# Patient Record
Sex: Male | Born: 1935 | Race: White | Hispanic: No | Marital: Married | Smoking: Former smoker
Health system: Southern US, Community
[De-identification: ages and names within clinical notes are randomized; demographics above are authoritative.]

## PROBLEM LIST (undated history)

## (undated) DIAGNOSIS — E7800 Pure hypercholesterolemia, unspecified: Secondary | ICD-10-CM

## (undated) DIAGNOSIS — F329 Major depressive disorder, single episode, unspecified: Secondary | ICD-10-CM

## (undated) DIAGNOSIS — G3184 Mild cognitive impairment, so stated: Secondary | ICD-10-CM

## (undated) DIAGNOSIS — I7090 Unspecified atherosclerosis: Secondary | ICD-10-CM

## (undated) DIAGNOSIS — I10 Essential (primary) hypertension: Secondary | ICD-10-CM

## (undated) DIAGNOSIS — I2510 Atherosclerotic heart disease of native coronary artery without angina pectoris: Secondary | ICD-10-CM

## (undated) DIAGNOSIS — E119 Type 2 diabetes mellitus without complications: Secondary | ICD-10-CM

## (undated) DIAGNOSIS — K529 Noninfective gastroenteritis and colitis, unspecified: Secondary | ICD-10-CM

## (undated) DIAGNOSIS — F32A Depression, unspecified: Secondary | ICD-10-CM

## (undated) DIAGNOSIS — G4733 Obstructive sleep apnea (adult) (pediatric): Secondary | ICD-10-CM

## (undated) DIAGNOSIS — H9190 Unspecified hearing loss, unspecified ear: Secondary | ICD-10-CM

## (undated) DIAGNOSIS — E039 Hypothyroidism, unspecified: Secondary | ICD-10-CM

## (undated) HISTORY — DX: Noninfective gastroenteritis and colitis, unspecified: K52.9

## (undated) HISTORY — DX: Type 2 diabetes mellitus without complications: E11.9

## (undated) HISTORY — DX: Mild cognitive impairment of uncertain or unknown etiology: G31.84

## (undated) HISTORY — DX: Unspecified hearing loss, unspecified ear: H91.90

## (undated) HISTORY — DX: Hypothyroidism, unspecified: E03.9

## (undated) HISTORY — DX: Unspecified atherosclerosis: I70.90

## (undated) HISTORY — PX: BLADDER SURGERY: SHX569

## (undated) HISTORY — DX: Atherosclerotic heart disease of native coronary artery without angina pectoris: I25.10

## (undated) HISTORY — PX: KNEE SURGERY: SHX244

## (undated) HISTORY — DX: Essential (primary) hypertension: I10

## (undated) HISTORY — DX: Pure hypercholesterolemia, unspecified: E78.00

## (undated) HISTORY — DX: Major depressive disorder, single episode, unspecified: F32.9

## (undated) HISTORY — DX: Obstructive sleep apnea (adult) (pediatric): G47.33

## (undated) HISTORY — PX: CATARACT EXTRACTION, BILATERAL: SHX1313

## (undated) HISTORY — PX: ELBOW FRACTURE SURGERY: SHX616

## (undated) HISTORY — DX: Depression, unspecified: F32.A

---

## 2018-11-04 ENCOUNTER — Ambulatory Visit: Payer: Medicare Other | Admitting: Neurology

## 2018-12-23 ENCOUNTER — Telehealth: Payer: Self-pay | Admitting: *Deleted

## 2018-12-23 ENCOUNTER — Encounter: Payer: Self-pay | Admitting: *Deleted

## 2018-12-23 NOTE — Telephone Encounter (Signed)
Spoke with Jory Sims, dgtr- in- law and advised her that due to current COVID 19 pandemic, our office is severely reducing in person visits in order to minimize the risk to our patients and healthcare providers. We recommend to convert your appointment to a video visit.  The patient is deaf and she stated she would rather have him come into office. We rescheduled his appt, and she verbalized understanding, appreciation.

## 2019-01-11 ENCOUNTER — Ambulatory Visit: Payer: Medicare Other | Admitting: Diagnostic Neuroimaging

## 2019-02-10 NOTE — Telephone Encounter (Signed)
LVM for Bobby Rojas- dgtr-in-law requesting a call back to discuss whether patient will come in office for visit next Tues afternoon, convert to video visit or reschedule. Left number for call back.

## 2019-02-10 NOTE — Telephone Encounter (Signed)
Noted. Appointment is flagged for sign language interpreter.

## 2019-02-10 NOTE — Telephone Encounter (Signed)
dght-in-law returned call and stated they would like to come in office for appt, advised to provide own mask, COVID questions asked, need of sign language interpreter

## 2019-02-14 ENCOUNTER — Encounter: Payer: Self-pay | Admitting: *Deleted

## 2019-02-15 ENCOUNTER — Ambulatory Visit: Payer: Medicare Other | Admitting: Diagnostic Neuroimaging

## 2019-02-15 ENCOUNTER — Other Ambulatory Visit: Payer: Self-pay

## 2019-02-15 ENCOUNTER — Encounter: Payer: Self-pay | Admitting: Diagnostic Neuroimaging

## 2019-02-15 VITALS — BP 147/78 | HR 68 | Temp 97.7°F | Ht 72.0 in | Wt 229.0 lb

## 2019-02-15 DIAGNOSIS — F0390 Unspecified dementia without behavioral disturbance: Secondary | ICD-10-CM

## 2019-02-15 DIAGNOSIS — G3183 Dementia with Lewy bodies: Secondary | ICD-10-CM | POA: Diagnosis not present

## 2019-02-15 DIAGNOSIS — F03B0 Unspecified dementia, moderate, without behavioral disturbance, psychotic disturbance, mood disturbance, and anxiety: Secondary | ICD-10-CM

## 2019-02-15 NOTE — Progress Notes (Signed)
[facility]  PATIENT: Bobby Rojas DOB: 1933/10/28  REFERRING CLINICIAN: Haimes, D HISTORY FROM: patient and daughter in law; with sign language interpreter REASON FOR VISIT: new consult    HISTORICAL  CHIEF COMPLAINT:  Chief Complaint  Patient presents with  . Memory Loss    rm 7, Mew pt, dgtr-in-law, Bobby Rojas, interpreter- Karen    HISTORY OF PRESENT ILLNESS:   85 year old male, hearing impaired, 6th grade education, here for evaluation of memory loss.  In July 2018 patient got lost driving, but eventually made it to his destination.  In January 2019 he went to get a haircut, ended up getting lost for more than 13 hours and was found by police sleeping in the back of his car.  He had evaluation was diagnosed with mild cognitive impairment.  He was tried on Aricept and Razadyne but these caused GI side effects.  Now on memantine and tolerating this better.  Patient lives at home with his wife.  He has problems with hygiene, remembering his medications, household chores.  He is no longer driving.  He is also developing short steps and balance difficulties.  He has had multiple falls.  No hallucinations.  He has had several bouts of confusion including thinking that his wife was deceased, when in fact she had just stepped out of the room for 5 minutes.  Patient's brother has been diagnosed with dementia with Lewy bodies.  Patient's wife is primary caregiver.  She is in limited health condition and therefore having difficulty taking care of patient.  They are looking at placement options.    REVIEW OF SYSTEMS: Full 14 system review of systems performed and negative with exception of: As per HPI.  ALLERGIES: No Known Allergies  HOME MEDICATIONS: Outpatient Medications Prior to Visit  Medication Sig Dispense Refill  . atorvastatin (LIPITOR) 40 MG tablet TAKE 1 TABLET BY MOUTH  DAILY    . Cholecalciferol (VITAMIN D3) 25 MCG (1000 UT) CAPS Take by mouth.    .  citalopram (CELEXA) 40 MG tablet TAKE 1 TABLET BY MOUTH  EVERY MORNING    . levothyroxine (SYNTHROID) 75 MCG tablet TAKE 1 TABLET BY MOUTH  DAILY AT 6AM    . lisinopril (ZESTRIL) 20 MG tablet TK 1 T PO QD    . memantine (NAMENDA) 5 MG tablet Take by mouth.    . metFORMIN (GLUCOPHAGE-XR) 500 MG 24 hr tablet Take 1,000 mg by mouth 2 (two) times daily.    . Multiple Vitamin (MULTIVITAMIN) tablet Take by mouth.    Marland Kitchen. rOPINIRole (REQUIP) 0.5 MG tablet     . donepezil (ARICEPT) 10 MG tablet     . famotidine (PEPCID) 20 MG tablet Take by mouth.    . galantamine (RAZADYNE ER) 8 MG 24 hr capsule      No facility-administered medications prior to visit.     PAST MEDICAL HISTORY: Past Medical History:  Diagnosis Date  . Atherosclerosis   . CAD (coronary artery disease)   . Deaf    bilateral  . Depression   . Diabetes mellitus without complication (HCC)   . Gastroenteritis   . Hypercholesterolemia   . Hypertension   . Hypothyroid   . MCI (mild cognitive impairment)   . OSA (obstructive sleep apnea)     PAST SURGICAL HISTORY: Past Surgical History:  Procedure Laterality Date  . BLADDER SURGERY    . CATARACT EXTRACTION, BILATERAL    . ELBOW FRACTURE SURGERY    . KNEE SURGERY Right  FAMILY HISTORY: No family history on file.  SOCIAL HISTORY: Social History   Socioeconomic History  . Marital status: Married    Spouse name: Not on file  . Number of children: 3  . Years of education: Not on file  . Highest education level: 6th grade  Occupational History  . Not on file  Social Needs  . Financial resource strain: Not on file  . Food insecurity    Worry: Not on file    Inability: Not on file  . Transportation needs    Medical: Not on file    Non-medical: Not on file  Tobacco Use  . Smoking status: Former Smoker    Types: Cigars  . Smokeless tobacco: Never Used  Substance and Sexual Activity  . Alcohol use: Not Currently  . Drug use: Never  . Sexual activity: Not on  file  Lifestyle  . Physical activity    Days per week: Not on file    Minutes per session: Not on file  . Stress: Not on file  Relationships  . Social Musicianconnections    Talks on phone: Not on file    Gets together: Not on file    Attends religious service: Not on file    Active member of club or organization: Not on file    Attends meetings of clubs or organizations: Not on file    Relationship status: Not on file  . Intimate partner violence    Fear of current or ex partner: Not on file    Emotionally abused: Not on file    Physically abused: Not on file    Forced sexual activity: Not on file  Other Topics Concern  . Not on file  Social History Narrative   Lives with wife at home     PHYSICAL EXAM  GENERAL EXAM/CONSTITUTIONAL: Vitals:  Vitals:   02/15/19 1504  BP: (!) 147/78  Pulse: 68  Temp: 97.7 F (36.5 C)  Weight: 229 lb (103.9 kg)  Height: 6' (1.829 m)     Body mass index is 31.06 kg/m. Wt Readings from Last 3 Encounters:  02/15/19 229 lb (103.9 kg)     Patient is in no distress; well developed, nourished and groomed; neck is supple  CARDIOVASCULAR:  Examination of carotid arteries is normal; no carotid bruits  Regular rate and rhythm, no murmurs  Examination of peripheral vascular system by observation and palpation is normal  EYES:  Ophthalmoscopic exam of optic discs and posterior segments is normal; no papilledema or hemorrhages  No exam data present  MUSCULOSKELETAL:  Gait, strength, tone, movements noted in Neurologic exam below  NEUROLOGIC: MENTAL STATUS:  MMSE - Mini Mental State Exam 02/15/2019  Orientation to time 0  Orientation to Place 1  Registration 0  Attention/ Calculation 0  Recall 0  Language- name 2 objects 2  Language- repeat 0  Language- follow 3 step command 0  Language- read & follow direction 1  Write a sentence 0  Copy design 0  Total score 4    awake, alert, oriented to person  [facility]   DECR  attention and concentration  language fluent, comprehension intact, naming intact; VIA SIGN LANGUAGE  fund of knowledge LIMITED  CRANIAL NERVE:   2nd - no papilledema on fundoscopic exam  2nd, 3rd, 4th, 6th - pupils equal and reactive to light, visual fields full to confrontation, extraocular muscles intact, no nystagmus  5th - facial sensation symmetric  7th - facial strength symmetric  8th -  hearing intact  9th - palate elevates symmetrically, uvula midline  11th - shoulder shrug symmetric  12th - tongue protrusion midline  MOTOR:   normal bulk  INCREASED COGWHEEL RIGIDITY IN BUE  full strength in the BUE, BLE  SENSORY:   normal and symmetric to light touch, temperature, vibration  COORDINATION:   finger-nose-finger, fine finger movements SLOW  REFLEXES:   deep tendon reflexes present and symmetric  GAIT/STATION:   SHORT STEPS; UNSTEADY     DIAGNOSTIC DATA (LABS, IMAGING, TESTING) - I reviewed patient records, labs, notes, testing and imaging myself where available.  No results found for: WBC, HGB, HCT, MCV, PLT No results found for: NA, K, CL, CO2, GLUCOSE, BUN, CREATININE, CALCIUM, PROT, ALBUMIN, AST, ALT, ALKPHOS, BILITOT, GFRNONAA, GFRAA No results found for: CHOL, HDL, LDLCALC, LDLDIRECT, TRIG, CHOLHDL No results found for: HGBA1C No results found for: VITAMINB12 No results found for: TSH   10/20/17 MRI brain 1. Moderate degree of chronic microvascular ischemic changes largely of the supratentorial and to a lesser extent infratentorial pathways. No signs of acute ischemia or hemorrhagic residua. Suggestion for iron deposition involving the lentiform nucleus which is of unclear clinical significance. 2. 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 No significant parenchymal changes of adjacent structures. 3. Mild diffuse cortical atrophy seen.    ASSESSMENT AND PLAN  85 y.o. year old male here with progressive short-term memory problems,  confusion, disorientation, navigation problems, decline in ADLs consistent with neurodegenerative dementia.  Dementia with Lewy bodies is a possibility.  Patient is in the moderate stage.  Dx:  1. Dementia with Lewy bodies (CODE) ([place])   2. Moderate dementia without behavioral disturbance (HCC)     PLAN:  - continue memantine; may increase up to 10mg  twice a day  - safety / supervision issues reviewed - caregiver resources provided - no driving  Return for return to PCP, pending if symptoms worsen or fail to improve.    Penni Bombard, MD 6/38/7564, 3:32 PM Certified in Neurology, Neurophysiology and Neuroimaging  [facility] [address] [place], [place] [address] [phone]

## 2021-02-12 ENCOUNTER — Emergency Department (HOSPITAL_BASED_OUTPATIENT_CLINIC_OR_DEPARTMENT_OTHER)
Admission: EM | Admit: 2021-02-12 | Discharge: 2021-02-12 | Disposition: A | Discharge status: Home / Self Care | Payer: Medicare Other | Attending: Emergency Medicine | Admitting: Emergency Medicine

## 2021-02-12 ENCOUNTER — Emergency Department (HOSPITAL_BASED_OUTPATIENT_CLINIC_OR_DEPARTMENT_OTHER): Payer: Medicare Other

## 2021-02-12 ENCOUNTER — Other Ambulatory Visit: Payer: Self-pay

## 2021-02-12 DIAGNOSIS — Z20822 Contact with and (suspected) exposure to covid-19: Secondary | ICD-10-CM | POA: Insufficient documentation

## 2021-02-12 DIAGNOSIS — R5383 Other fatigue: Secondary | ICD-10-CM | POA: Insufficient documentation

## 2021-02-12 DIAGNOSIS — I2510 Atherosclerotic heart disease of native coronary artery without angina pectoris: Secondary | ICD-10-CM | POA: Insufficient documentation

## 2021-02-12 DIAGNOSIS — R4182 Altered mental status, unspecified: Secondary | ICD-10-CM | POA: Insufficient documentation

## 2021-02-12 DIAGNOSIS — E039 Hypothyroidism, unspecified: Secondary | ICD-10-CM | POA: Diagnosis not present

## 2021-02-12 DIAGNOSIS — I10 Essential (primary) hypertension: Secondary | ICD-10-CM | POA: Diagnosis not present

## 2021-02-12 DIAGNOSIS — E860 Dehydration: Secondary | ICD-10-CM | POA: Insufficient documentation

## 2021-02-12 DIAGNOSIS — E119 Type 2 diabetes mellitus without complications: Secondary | ICD-10-CM | POA: Diagnosis not present

## 2021-02-12 DIAGNOSIS — Z79899 Other long term (current) drug therapy: Secondary | ICD-10-CM | POA: Diagnosis not present

## 2021-02-12 DIAGNOSIS — Z7984 Long term (current) use of oral hypoglycemic drugs: Secondary | ICD-10-CM | POA: Insufficient documentation

## 2021-02-12 DIAGNOSIS — Z87891 Personal history of nicotine dependence: Secondary | ICD-10-CM | POA: Diagnosis not present

## 2021-02-12 LAB — LIPASE, BLOOD: Lipase: 64 U/L — ABNORMAL HIGH (ref 11–51)

## 2021-02-12 LAB — URINALYSIS, ROUTINE W REFLEX MICROSCOPIC
Bilirubin Urine: NEGATIVE
Glucose, UA: NEGATIVE mg/dL
Hgb urine dipstick: NEGATIVE
Ketones, ur: NEGATIVE mg/dL
Leukocytes,Ua: NEGATIVE
Nitrite: NEGATIVE
Protein, ur: NEGATIVE mg/dL
Specific Gravity, Urine: 1.015 (ref 1.005–1.030)
pH: 7 (ref 5.0–8.0)

## 2021-02-12 LAB — CBC WITH DIFFERENTIAL/PLATELET
Abs Immature Granulocytes: 0.03 10*3/uL (ref 0.00–0.07)
Basophils Absolute: 0.1 10*3/uL (ref 0.0–0.1)
Basophils Relative: 1 %
Eosinophils Absolute: 0.2 10*3/uL (ref 0.0–0.5)
Eosinophils Relative: 3 %
HCT: 51.5 % (ref 39.0–52.0)
Hemoglobin: 17.2 g/dL — ABNORMAL HIGH (ref 13.0–17.0)
Immature Granulocytes: 0 %
Lymphocytes Relative: 17 %
Lymphs Abs: 1.2 10*3/uL (ref 0.7–4.0)
MCH: 31.2 pg (ref 26.0–34.0)
MCHC: 33.4 g/dL (ref 30.0–36.0)
MCV: 93.5 fL (ref 80.0–100.0)
Monocytes Absolute: 0.7 10*3/uL (ref 0.1–1.0)
Monocytes Relative: 9 %
Neutro Abs: 4.8 10*3/uL (ref 1.7–7.7)
Neutrophils Relative %: 70 %
Platelets: 342 10*3/uL (ref 150–400)
RBC: 5.51 MIL/uL (ref 4.22–5.81)
RDW: 13.2 % (ref 11.5–15.5)
WBC: 6.9 10*3/uL (ref 4.0–10.5)
nRBC: 0 % (ref 0.0–0.2)

## 2021-02-12 LAB — RESP PANEL BY RT-PCR (FLU A&B, COVID) ARPGX2
Influenza A by PCR: NEGATIVE
Influenza B by PCR: NEGATIVE
SARS Coronavirus 2 by RT PCR: NEGATIVE

## 2021-02-12 LAB — COMPREHENSIVE METABOLIC PANEL
ALT: 16 U/L (ref 0–44)
AST: 19 U/L (ref 15–41)
Albumin: 4.1 g/dL (ref 3.5–5.0)
Alkaline Phosphatase: 60 U/L (ref 38–126)
Anion gap: 7 (ref 5–15)
BUN: 18 mg/dL (ref 8–23)
CO2: 29 mmol/L (ref 22–32)
Calcium: 8.9 mg/dL (ref 8.9–10.3)
Chloride: 102 mmol/L (ref 98–111)
Creatinine, Ser: 1.31 mg/dL — ABNORMAL HIGH (ref 0.61–1.24)
GFR, Estimated: 53 mL/min — ABNORMAL LOW (ref >60)
Glucose, Bld: 131 mg/dL — ABNORMAL HIGH (ref 70–99)
Potassium: 4.4 mmol/L (ref 3.5–5.1)
Sodium: 138 mmol/L (ref 135–145)
Total Bilirubin: 1.1 mg/dL (ref 0.3–1.2)
Total Protein: 6.8 g/dL (ref 6.5–8.1)

## 2021-02-12 LAB — LACTIC ACID, PLASMA: Lactic Acid, Venous: 1.5 mmol/L (ref 0.5–1.9)

## 2021-02-12 LAB — TSH: TSH: 1.31 u[IU]/mL (ref 0.350–4.500)

## 2021-02-12 IMAGING — CT CT HEAD W/O CM
3 series · 14 of 47 positions shown, 16 images · non-contrast
Comparison: [DATE]

CLINICAL DATA: Altered mental status.

EXAM:
CT HEAD WITHOUT CONTRAST
TECHNIQUE: Contiguous axial images were obtained from the base of the skull
through the vertex without intravenous contrast.

[Series 2: head wo · axial · 0.47mm/px · z∈[-84,+56]mm · 8 of 34 slices shown, 10 images]
[im 3/34  brain]
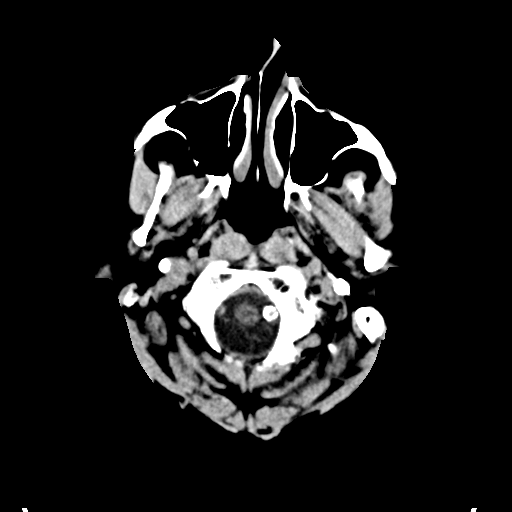
[im 3/34  bone]
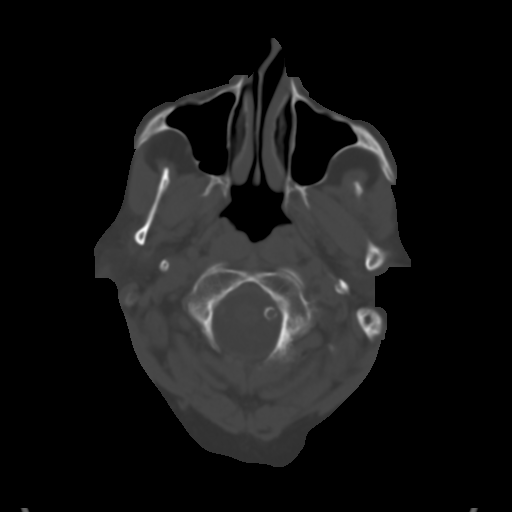
[im 7/34  brain]
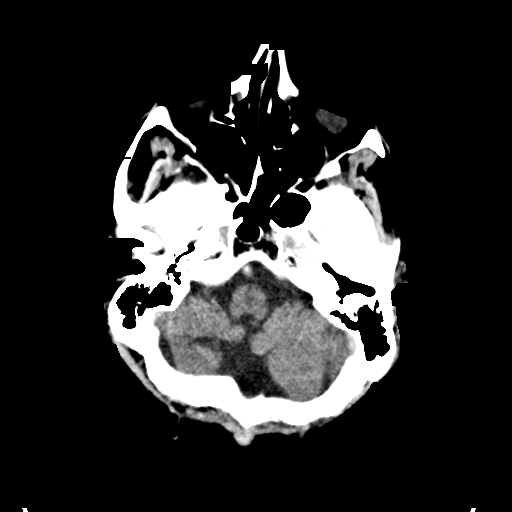
[im 11/34  brain]
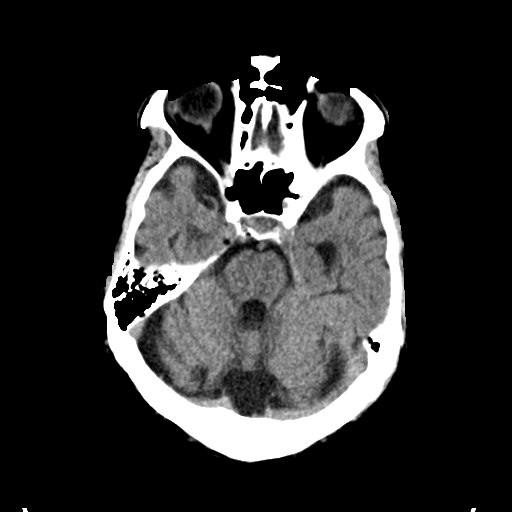
[im 15/34  brain]
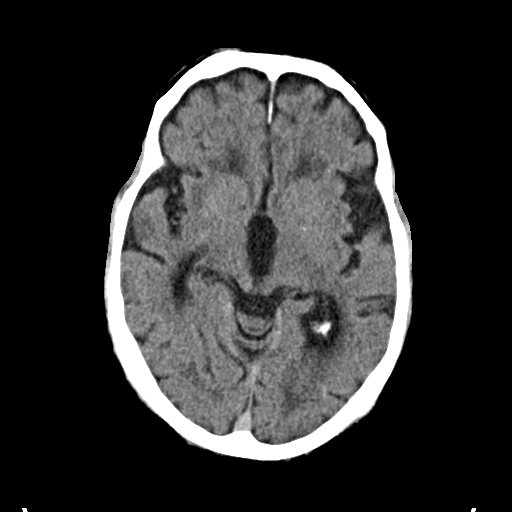
[im 19/34  brain]
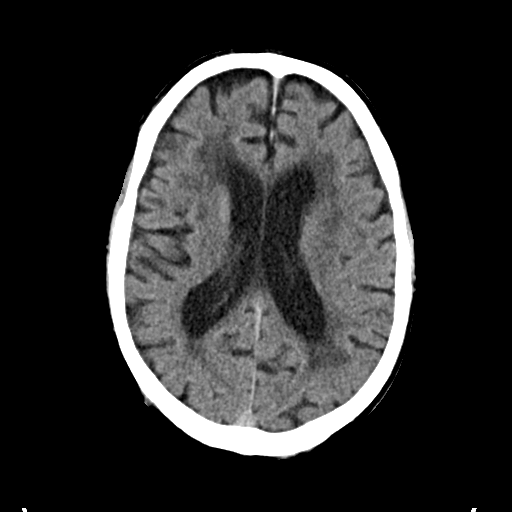
[im 19/34  bone]
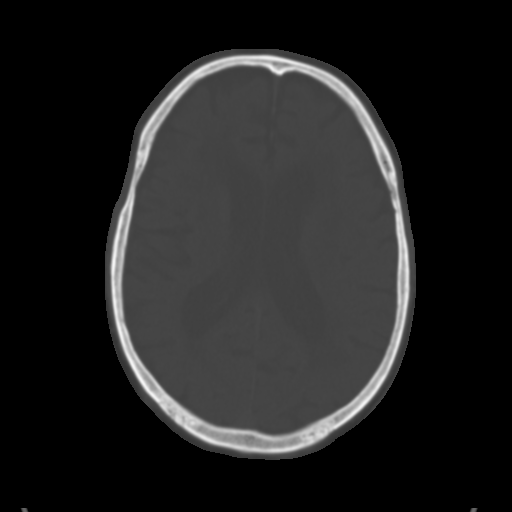
[im 23/34  brain]
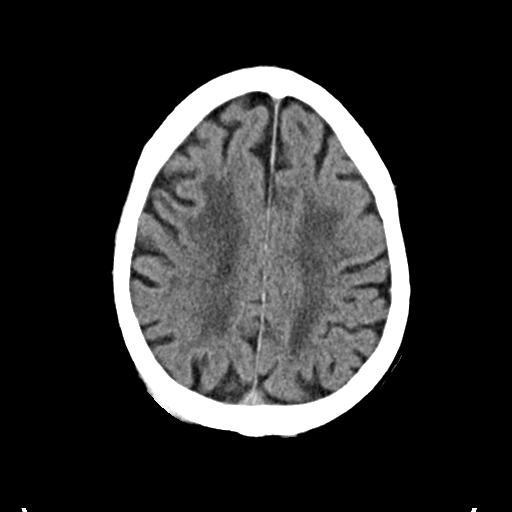
[im 27/34  brain]
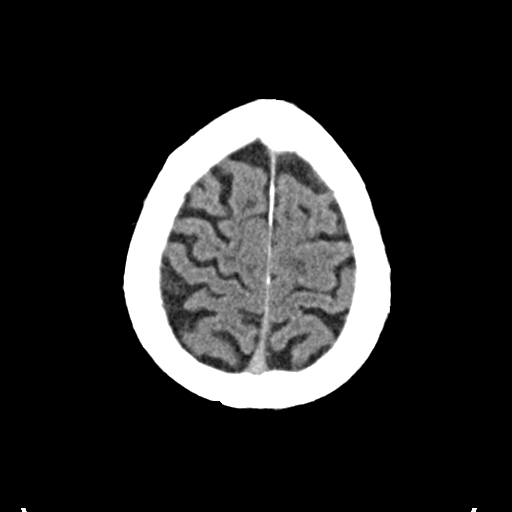
[im 31/34  brain]
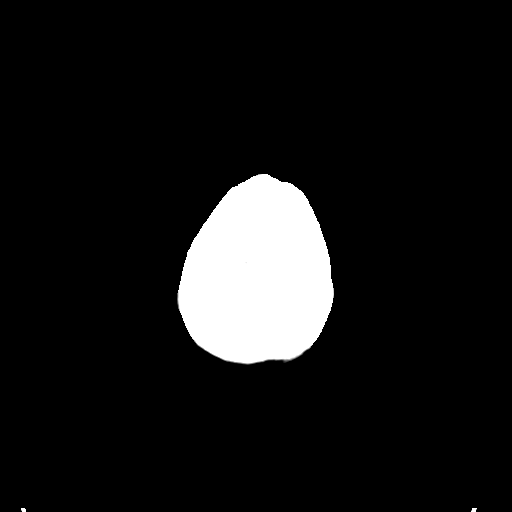

[Series 4: coronal soft · coronal · 0.32mm/px · 3 of 73 slices shown]
[im 25/73  brain]
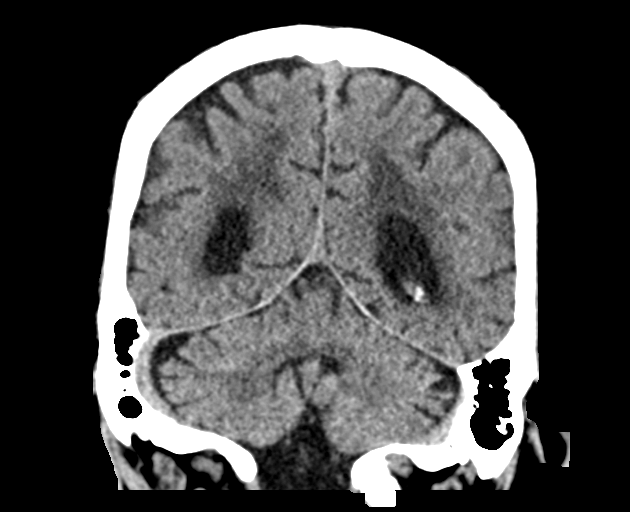
[im 33/73  brain]
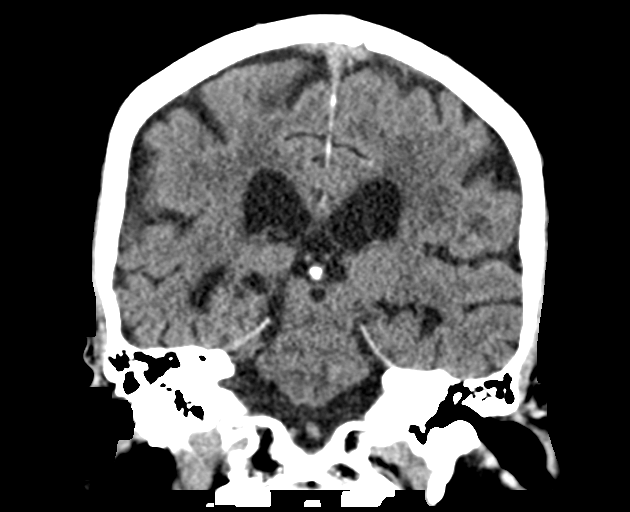
[im 41/73  brain]
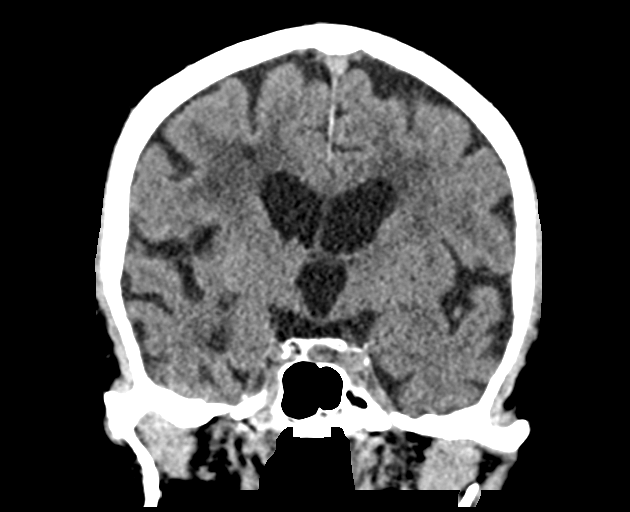

[Series 5: sag soft · sagittal · 0.32mm/px · 3 of 67 slices shown]
[im 23/67  brain]
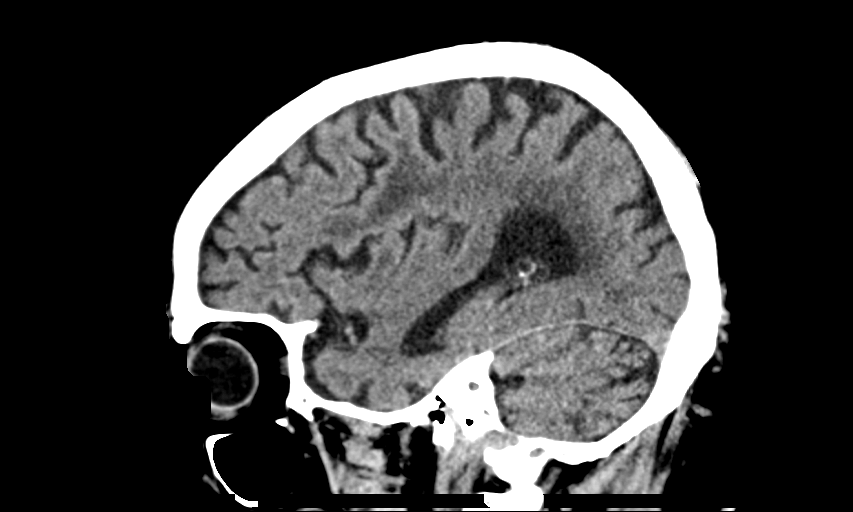
[im 34/67  brain]
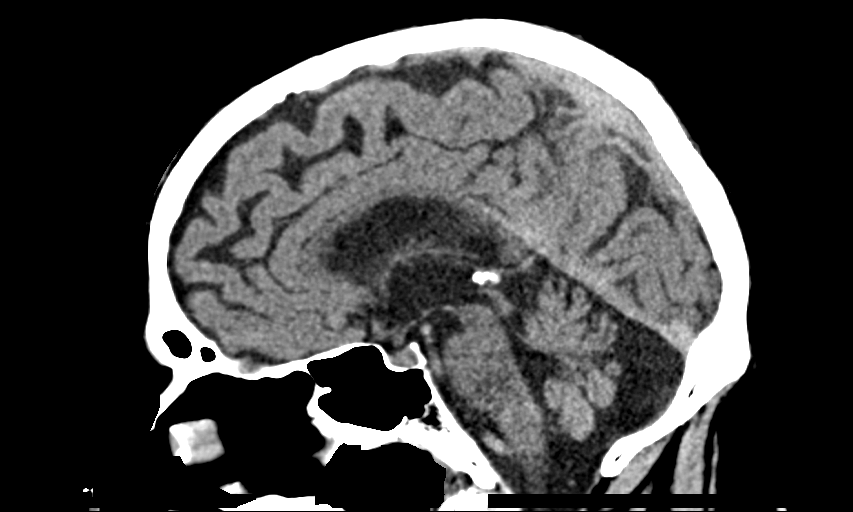
[im 45/67  brain]
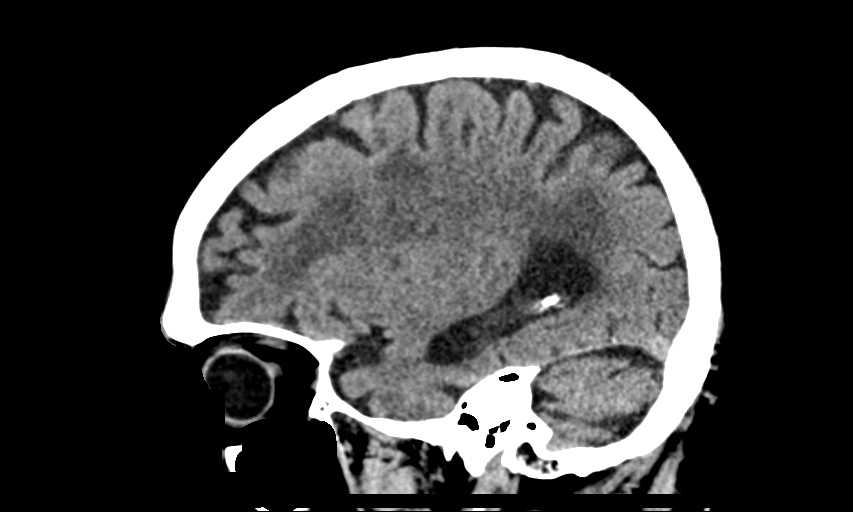

[14 of 47 positions shown; findings below may reference images not displayed]

FINDINGS: Brain: There is mild cerebral atrophy with widening of the
extra-axial spaces and ventricular dilatation.
There are areas of decreased attenuation within the white matter
tracts of the supratentorial brain, consistent with microvascular
disease changes.

Small, chronic bilateral basal ganglia lacunar infarcts are noted.

Chronic right cerebellar infarcts are also seen.

Vascular: No hyperdense vessel or unexpected calcification.

Skull: Normal. Negative for fracture or focal lesion.

Sinuses/Orbits: No acute finding.

Other: None.
IMPRESSION: 1. Generalized cerebral atrophy with chronic right cerebellar and
bilateral basal ganglia infarcts.
2. No acute intracranial abnormality.

## 2021-02-12 IMAGING — DX DG CHEST 2V
2 series · 2 of 2 positions shown · non-contrast
Comparison: [DATE]

CLINICAL DATA: Fatigue.

EXAM:
CHEST - 2 VIEW

[chest lat]
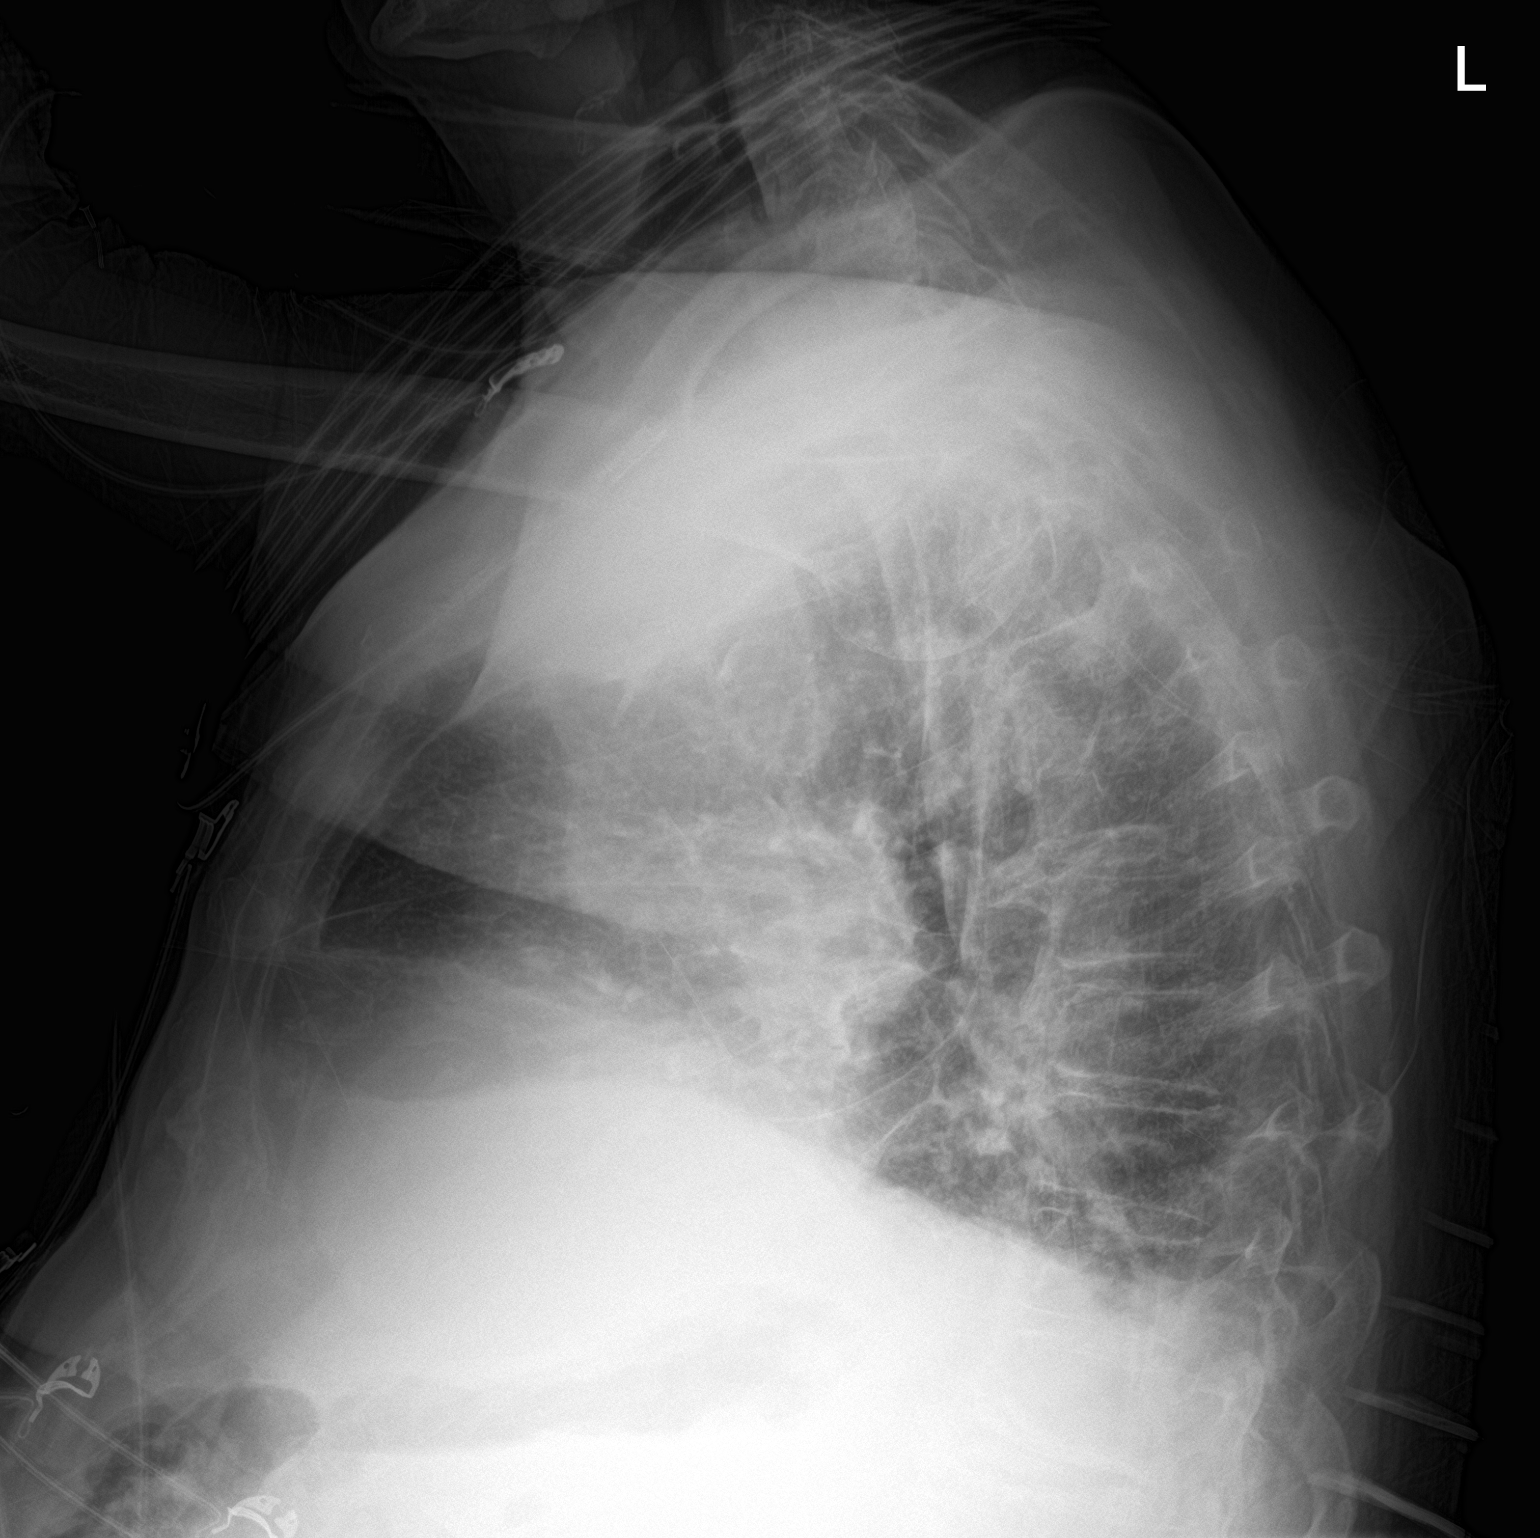

[chest ap strecther]
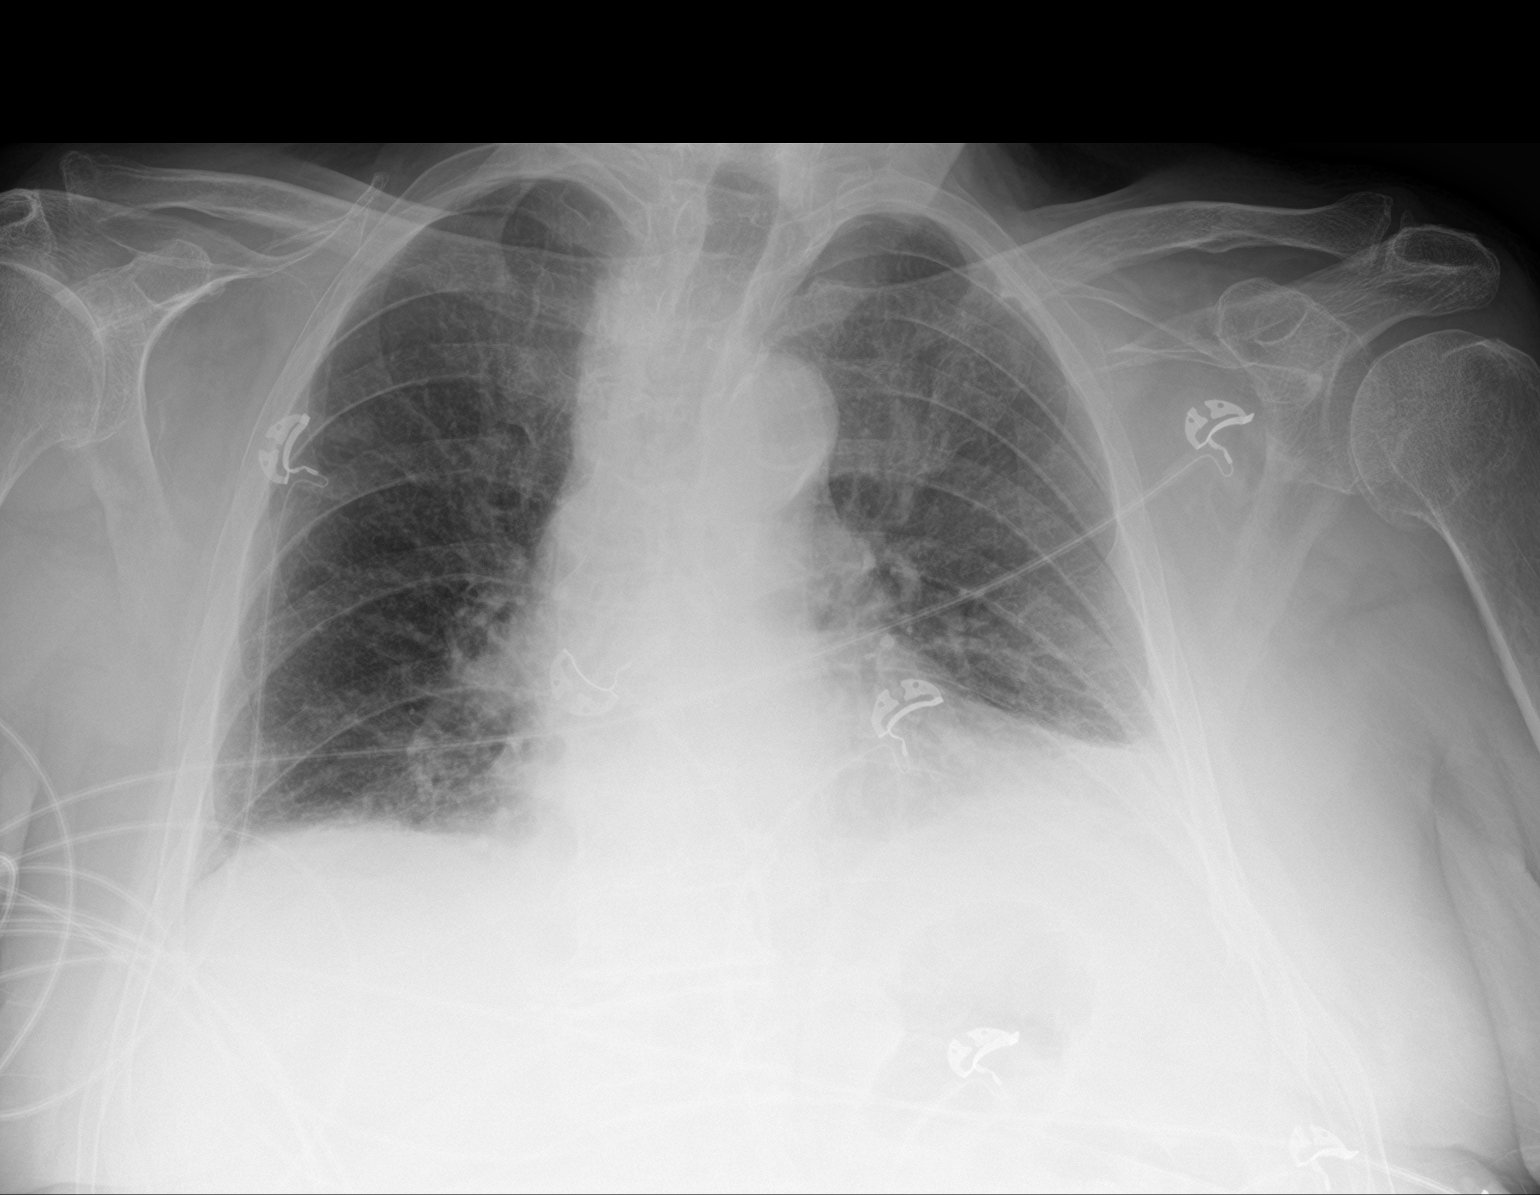

[2 of 2 positions shown; findings below may reference images not displayed]

FINDINGS: Low lung volumes are seen with mild, diffuse, chronic appearing
increased interstitial lung markings. Very mild atelectasis is seen
within the left lung base. There is no evidence of a pleural
effusion or pneumothorax. The cardiac silhouette is mildly enlarged.
There is moderate severity calcification of the aortic arch.
Multilevel degenerative changes seen throughout the thoracic spine.
IMPRESSION: Low lung volumes with very mild left basilar atelectasis.

## 2021-02-12 MED ORDER — SODIUM CHLORIDE 0.9 % IV BOLUS
500.0000 mL | Freq: Once | INTRAVENOUS | Status: AC
  Administered 2021-02-12: 500 mL via INTRAVENOUS

## 2021-02-12 MED ORDER — ATROPINE SULFATE 1 MG/ML IJ SOLN
INTRAMUSCULAR | Status: AC
  Filled 2021-02-12: qty 1

## 2021-02-12 NOTE — ED Triage Notes (Signed)
Arrived ems  staff stated that pt signed that he was in pain,  per ems pt could not tell them where he hurt,  at times he text to ems staff that he was in pain and other times stated no pain,  wife died about 1 month ago and depression meds were increased  pain is non verbal

## 2021-02-12 NOTE — ED Provider Notes (Signed)
[facility] EMERGENCY DEPARTMENT Provider Note   CSN: [id number] Arrival date & time: 02/12/21  1606     History No chief complaint on file.   Bobby PandaBuddy Rojas is a 87 y.o. male.  The history is provided by the patient and medical records. The history is limited by a language barrier. A language interpreter was used (ASL).  Illness Location:  Generalized fatigue Quality:  Moderate Severity:  Moderate Onset quality:  Gradual Duration:  1 day Timing:  Constant Progression:  Unchanged Chronicity:  New Associated symptoms: fatigue   Associated symptoms: no abdominal pain, no chest pain, no congestion, no cough, no diarrhea, no fever, no headaches, no loss of consciousness, no nausea, no rash, no rhinorrhea, no shortness of breath, no vomiting and no wheezing       Past Medical History:  Diagnosis Date   Atherosclerosis    CAD (coronary artery disease)    Deaf    bilateral   Depression    Diabetes mellitus without complication (HCC)    Gastroenteritis    Hypercholesterolemia    Hypertension    Hypothyroid    MCI (mild cognitive impairment)    OSA (obstructive sleep apnea)     There are no problems to display for this patient.   Past Surgical History:  Procedure Laterality Date   BLADDER SURGERY     CATARACT EXTRACTION, BILATERAL     ELBOW FRACTURE SURGERY     KNEE SURGERY Right        No family history on file.  Social History   Tobacco Use   Smoking status: Former    Pack years: 0.00    Types: Cigars   Smokeless tobacco: Never  Substance Use Topics   Alcohol use: Not Currently   Drug use: Never    Home Medications Prior to Admission medications   Medication Sig Start Date End Date Taking? Authorizing Provider  atorvastatin (LIPITOR) 40 MG tablet TAKE 1 TABLET BY MOUTH  DAILY 08/23/18   [provider]  Cholecalciferol (VITAMIN D3) 25 MCG (1000 UT) CAPS Take by mouth.    [provider]  citalopram (CELEXA) 40 MG tablet TAKE  1 TABLET BY MOUTH  EVERY MORNING 11/12/18   [provider]  famotidine (PEPCID) 20 MG tablet Take by mouth. 10/19/18   [provider]  levothyroxine (SYNTHROID) 75 MCG tablet TAKE 1 TABLET BY MOUTH  DAILY AT 6AM 08/01/15   [provider]  lisinopril (ZESTRIL) 20 MG tablet TK 1 T PO QD 10/14/18   [provider]  memantine (NAMENDA) 5 MG tablet Take by mouth. 11/12/18   [provider]  metFORMIN (GLUCOPHAGE-XR) 500 MG 24 hr tablet Take 1,000 mg by mouth 2 (two) times daily. 10/14/18   [provider]  Multiple Vitamin (MULTIVITAMIN) tablet Take by mouth.    [provider]  rOPINIRole (REQUIP) 0.5 MG tablet  10/29/18   [provider]    Allergies    Patient has no known allergies.  Review of Systems   Review of Systems  Constitutional:  Positive for appetite change and fatigue. Negative for chills, diaphoresis and fever.  HENT:  Negative for congestion and rhinorrhea.   Eyes:  Negative for visual disturbance.  Respiratory:  Negative for cough, chest tightness, shortness of breath and wheezing.   Cardiovascular:  Negative for chest pain and palpitations.  Gastrointestinal:  Negative for abdominal pain, constipation, diarrhea, nausea and vomiting.  Genitourinary:  Negative for dysuria, flank pain and frequency.  Musculoskeletal:  Negative for back pain and neck pain.  Skin:  Negative for rash and wound.  Neurological:  Negative for loss of consciousness, weakness, light-headedness, numbness and headaches.  Psychiatric/Behavioral:  Negative for agitation and confusion.   All other systems reviewed and are negative.  Physical Exam Updated Vital Signs BP (!) 142/70 (BP Location: Right Arm)   Pulse (!) 57   Temp 97.7 F (36.5 C) (Oral)   Resp 14   SpO2 98%   Physical Exam Vitals and nursing note reviewed.  Constitutional:      General: He is not in acute distress.    Appearance: He is well-developed. He is not  ill-appearing, toxic-appearing or diaphoretic.  HENT:     Head: Normocephalic and atraumatic.     Nose: No congestion or rhinorrhea.     Mouth/Throat:     Mouth: Mucous membranes are dry.     Pharynx: No oropharyngeal exudate or posterior oropharyngeal erythema.  Eyes:     Conjunctiva/sclera: Conjunctivae normal.     Pupils: Pupils are equal, round, and reactive to light.  Cardiovascular:     Rate and Rhythm: Normal rate and regular rhythm.     Heart sounds: No murmur heard. Pulmonary:     Effort: Pulmonary effort is normal. No respiratory distress.     Breath sounds: Normal breath sounds. No wheezing, rhonchi or rales.  Chest:     Chest wall: No tenderness.  Abdominal:     Palpations: Abdomen is soft.     Tenderness: There is no abdominal tenderness. There is no right CVA tenderness, left CVA tenderness, guarding or rebound.  Musculoskeletal:        General: No tenderness.     Cervical back: Neck supple. No tenderness.     Right lower leg: No edema.     Left lower leg: No edema.  Skin:    General: Skin is warm and dry.     Capillary Refill: Capillary refill takes less than 2 seconds.     Findings: No erythema or rash.  Neurological:     General: No focal deficit present.     Mental Status: He is alert.     Sensory: No sensory deficit.     Motor: No weakness.  Psychiatric:        Mood and Affect: Mood normal.    ED Results / Procedures / Treatments   Labs (all labs ordered are listed, but only abnormal results are displayed) Labs Reviewed  CBC WITH DIFFERENTIAL/PLATELET - Abnormal; Notable for the following components:      Result Value   Hemoglobin 17.2 (*)    All other components within normal limits  COMPREHENSIVE METABOLIC PANEL - Abnormal; Notable for the following components:   Glucose, Bld 131 (*)    Creatinine, Ser 1.31 (*)    GFR, Estimated 53 (*)    All other components within normal limits  LIPASE, BLOOD - Abnormal; Notable for the following components:    Lipase 64 (*)    All other components within normal limits  RESP PANEL BY RT-PCR (FLU A&B, COVID) [id number]  URINE CULTURE  LACTIC ACID, PLASMA  URINALYSIS, ROUTINE W REFLEX MICROSCOPIC  LACTIC ACID, PLASMA  TSH    EKG EKG Interpretation  Date/Time:  Tuesday February 12 2021 16:45:56 EDT Ventricular Rate:  55 PR Interval:  173 QRS Duration: 88 QT Interval:  459 QTC Calculation: 439 R Axis:   20 Text Interpretation: Sinus rhythm Low voltage, precordial leads NO prior ECG for  comparison No STEMI Confirmed by Theda Belfast ([id number]) on 02/12/2021 4:55:25 PM  Radiology DG Chest 2 View  Result Date: 02/12/2021 CLINICAL DATA:  Fatigue. EXAM: CHEST - 2 VIEW COMPARISON:  September 21, 2018 FINDINGS: Low lung volumes are seen with mild, diffuse, chronic appearing increased interstitial lung markings. Very mild atelectasis is seen within the left lung base. There is no evidence of a pleural effusion or pneumothorax. The cardiac silhouette is mildly enlarged. There is moderate severity calcification of the aortic arch. Multilevel degenerative changes seen throughout the thoracic spine. IMPRESSION: Low lung volumes with very mild left basilar atelectasis. Electronically Signed   By: Aram Candela M.D.   On: 02/12/2021 17:43   CT Head Wo Contrast  Result Date: 02/12/2021 CLINICAL DATA:  Altered mental status. EXAM: CT HEAD WITHOUT CONTRAST TECHNIQUE: Contiguous axial images were obtained from the base of the skull through the vertex without intravenous contrast. COMPARISON:  July 10, 2020 FINDINGS: Brain: There is mild cerebral atrophy with widening of the extra-axial spaces and ventricular dilatation. There are areas of decreased attenuation within the white matter tracts of the supratentorial brain, consistent with microvascular disease changes. Small, chronic bilateral basal ganglia lacunar infarcts are noted. Chronic right cerebellar infarcts are also seen. Vascular: No hyperdense vessel or  unexpected calcification. Skull: Normal. Negative for fracture or focal lesion. Sinuses/Orbits: No acute finding. Other: None. IMPRESSION: 1. Generalized cerebral atrophy with chronic right cerebellar and bilateral basal ganglia infarcts. 2. No acute intracranial abnormality. Electronically Signed   By: Aram Candela M.D.   On: 02/12/2021 17:45    Procedures Procedures   Medications Ordered in ED Medications  sodium chloride 0.9 % bolus 500 mL (0 mLs Intravenous Stopped 02/12/21 2007)    ED Course  I have reviewed the triage vital signs and the nursing notes.  Pertinent labs & imaging results that were available during my care of the patient were reviewed by me and considered in my medical decision making (see chart for details).    MDM Rules/Calculators/A&P                          Domingos Mejorado is a 87 y.o. male with a past medical history significant for complete deafness with sign language dependence, hypothyroidism, hypertension, hypercholesterolemia, GERD, diabetes, CAD, mild cog impairment, and depression who presents from a nursing facility for report of fatigue, not eating food, not taking medicine, and complaining of pain.  According to EMS, patient arrives from facility where for the first time today, he had a change in attitude where he was not eating, not drinking, and was signing to them that he was having some pain.  He did not elaborate EMS where he was hurting at 1 point, did tell EMS that it was some emotional type pain.  Per EMS, patient recently lost his wife about a month ago.  According to EMS and nursing team on arrival, patient was covered in urine and was acting very fatigued and sleepy.  A sign language interpreter was used for all conversation with the patient.  Patient is denying any fevers, chills, chest pain, shortness of breath, palpitations, cough, nausea, vomiting, constipation, diarrhea, or urinary changes.  He is denying to me any pain in locations including  no back pain, extremity pains, headache, or neck pain.  He is reporting feeling very tired and fatigued and drained and does not know why he has no appetite and is not taking medications today.  He  did not report any depression to me but just shook his head that he did not know.  On exam, lungs are clear and chest is nontender.  Abdomen is nontender.  Extremities nontender.  Good pulses in extremities.  Moving all extremities.  Symmetric smile.  Patient did not speak but did move his hands with sign language.  Mucous membranes are slightly dry.  No rashes seen.  Patient was cleaned up by emergency team prior to my evaluation.  Will get screening work-up including x-ray, CT head, urine, and labs to look for etiology of the patient's severe fatigue and mental status change with no appetite and not taking medications.  If work-up is reassuring, anticipate reassessment to determine disposition.   Patient's work-up was overall reassuring.  Patient was back to his mental status baseline after the fluids per the son.  Patient was much more interactive and able to sign and conversation.  Patient's urine does not show infection.  CT head did not show acute stroke or bleed.  No pneumonia or other significant electrolyte abnormality seen.  Suspect some hemoconcentration with his dehydration and now he is back to baseline, we feel he is safe for discharge home.  Son agrees and will get him discharged to follow-up with PCP and have him maintain hydration at his facility.   Final Clinical Impression(s) / ED Diagnoses Final diagnoses:  Fatigue, unspecified type  Dehydration    Rx / DC Orders ED Discharge Orders     None       Clinical Impression: 1. Fatigue, unspecified type   2. Dehydration     Disposition: Discharge  Condition: Good  I have discussed the results, Dx and Tx plan with the pt(& family if present). He/she/they expressed understanding and agree(s) with the plan. Discharge  instructions discussed at great length. Strict return precautions discussed and pt &/or family have verbalized understanding of the instructions. No further questions at time of discharge.    Discharge Medication List as of 02/12/2021  9:57 PM      Follow Up: Jolene Provost, MD [address] [name] [address] [phone]     [facility]Y DEPARTMENT [address] [id number] UV [id number] [place] [address] [phone]       Javeion Cannedy, Canary Brim, MD 02/12/21 2231

## 2021-02-12 NOTE — ED Triage Notes (Signed)
Triage via sign language interpreter by Dr Julieanne Manson. Pt. Denies pain, fever, chills.. C/o Fatigue NOS.

## 2021-02-12 NOTE — ED Notes (Addendum)
Upon moving over to stretcher, bed noted to be saturated in urine. Pants covered in urine, as well as full depends. Pt cleaned with soap and water, as well as barrier cream applied. New depends placed and changed into a gown. Urine smelled malodorous.

## 2021-02-12 NOTE — Discharge Instructions (Addendum)
Your work-up today was overall reassuring but we did see some signs indicative of some dehydration.  After the fluids, you had improvement in her mental status.  We did not find convincing evidence of significant abnormalities or infection.  As you are feeling better, we feel you are safe for discharge back to your facility.  Please encourage hydration for the next few days and please rest.  If any symptoms change or worsen acutely, please return to the nearest emergency department.

## 2021-02-12 NOTE — ED Notes (Signed)
Patient transported to CT 

## 2021-02-14 LAB — URINE CULTURE

## 2021-04-23 ENCOUNTER — Other Ambulatory Visit: Payer: Self-pay

## 2021-04-23 ENCOUNTER — Inpatient Hospital Stay (HOSPITAL_COMMUNITY)
Admission: EM | Admit: 2021-04-23 | Discharge: 2021-04-28 | DRG: 069 | Disposition: A | Discharge status: Skilled Nursing Facility | Payer: Medicare Other | Source: Skilled Nursing Facility | Attending: Internal Medicine | Admitting: Internal Medicine

## 2021-04-23 ENCOUNTER — Emergency Department (HOSPITAL_COMMUNITY): Payer: Medicare Other

## 2021-04-23 ENCOUNTER — Observation Stay (HOSPITAL_COMMUNITY): Payer: Medicare Other

## 2021-04-23 DIAGNOSIS — I10 Essential (primary) hypertension: Secondary | ICD-10-CM | POA: Diagnosis present

## 2021-04-23 DIAGNOSIS — Z7984 Long term (current) use of oral hypoglycemic drugs: Secondary | ICD-10-CM

## 2021-04-23 DIAGNOSIS — G9341 Metabolic encephalopathy: Secondary | ICD-10-CM | POA: Diagnosis present

## 2021-04-23 DIAGNOSIS — F32A Depression, unspecified: Secondary | ICD-10-CM | POA: Diagnosis present

## 2021-04-23 DIAGNOSIS — J90 Pleural effusion, not elsewhere classified: Secondary | ICD-10-CM | POA: Diagnosis present

## 2021-04-23 DIAGNOSIS — E1159 Type 2 diabetes mellitus with other circulatory complications: Secondary | ICD-10-CM

## 2021-04-23 DIAGNOSIS — Z79899 Other long term (current) drug therapy: Secondary | ICD-10-CM

## 2021-04-23 DIAGNOSIS — R29704 NIHSS score 4: Secondary | ICD-10-CM | POA: Diagnosis present

## 2021-04-23 DIAGNOSIS — E785 Hyperlipidemia, unspecified: Secondary | ICD-10-CM | POA: Diagnosis present

## 2021-04-23 DIAGNOSIS — E119 Type 2 diabetes mellitus without complications: Secondary | ICD-10-CM | POA: Diagnosis present

## 2021-04-23 DIAGNOSIS — I639 Cerebral infarction, unspecified: Secondary | ICD-10-CM | POA: Diagnosis not present

## 2021-04-23 DIAGNOSIS — I2510 Atherosclerotic heart disease of native coronary artery without angina pectoris: Secondary | ICD-10-CM | POA: Diagnosis present

## 2021-04-23 DIAGNOSIS — Z79890 Hormone replacement therapy (postmenopausal): Secondary | ICD-10-CM

## 2021-04-23 DIAGNOSIS — G459 Transient cerebral ischemic attack, unspecified: Secondary | ICD-10-CM

## 2021-04-23 DIAGNOSIS — J9601 Acute respiratory failure with hypoxia: Secondary | ICD-10-CM | POA: Diagnosis present

## 2021-04-23 DIAGNOSIS — E7800 Pure hypercholesterolemia, unspecified: Secondary | ICD-10-CM | POA: Diagnosis present

## 2021-04-23 DIAGNOSIS — H913 Deaf nonspeaking, not elsewhere classified: Secondary | ICD-10-CM | POA: Diagnosis present

## 2021-04-23 DIAGNOSIS — G4733 Obstructive sleep apnea (adult) (pediatric): Secondary | ICD-10-CM | POA: Diagnosis present

## 2021-04-23 DIAGNOSIS — R29810 Facial weakness: Secondary | ICD-10-CM | POA: Diagnosis present

## 2021-04-23 DIAGNOSIS — U071 COVID-19: Secondary | ICD-10-CM | POA: Diagnosis not present

## 2021-04-23 DIAGNOSIS — Z66 Do not resuscitate: Secondary | ICD-10-CM | POA: Diagnosis present

## 2021-04-23 DIAGNOSIS — G8194 Hemiplegia, unspecified affecting left nondominant side: Secondary | ICD-10-CM | POA: Diagnosis present

## 2021-04-23 DIAGNOSIS — E039 Hypothyroidism, unspecified: Secondary | ICD-10-CM | POA: Diagnosis present

## 2021-04-23 DIAGNOSIS — J1282 Pneumonia due to coronavirus disease 2019: Secondary | ICD-10-CM | POA: Diagnosis present

## 2021-04-23 DIAGNOSIS — F0390 Unspecified dementia without behavioral disturbance: Secondary | ICD-10-CM | POA: Diagnosis present

## 2021-04-23 DIAGNOSIS — R531 Weakness: Secondary | ICD-10-CM

## 2021-04-23 DIAGNOSIS — Z87891 Personal history of nicotine dependence: Secondary | ICD-10-CM

## 2021-04-23 DIAGNOSIS — R0600 Dyspnea, unspecified: Secondary | ICD-10-CM

## 2021-04-23 LAB — CBG MONITORING, ED
Glucose-Capillary: 131 mg/dL — ABNORMAL HIGH (ref 70–99)
Glucose-Capillary: 115 mg/dL — ABNORMAL HIGH (ref 70–99)

## 2021-04-23 LAB — COMPREHENSIVE METABOLIC PANEL
ALT: 18 U/L (ref 0–44)
AST: 34 U/L (ref 15–41)
Albumin: 3.6 g/dL (ref 3.5–5.0)
Alkaline Phosphatase: 51 U/L (ref 38–126)
Anion gap: 7 (ref 5–15)
BUN: 22 mg/dL (ref 8–23)
CO2: 26 mmol/L (ref 22–32)
Calcium: 8.8 mg/dL — ABNORMAL LOW (ref 8.9–10.3)
Chloride: 105 mmol/L (ref 98–111)
Creatinine, Ser: 1.32 mg/dL — ABNORMAL HIGH (ref 0.61–1.24)
GFR, Estimated: 53 mL/min — ABNORMAL LOW (ref >60)
Glucose, Bld: 132 mg/dL — ABNORMAL HIGH (ref 70–99)
Potassium: 4.7 mmol/L (ref 3.5–5.1)
Sodium: 138 mmol/L (ref 135–145)
Total Bilirubin: 1.3 mg/dL — ABNORMAL HIGH (ref 0.3–1.2)
Total Protein: 6.1 g/dL — ABNORMAL LOW (ref 6.5–8.1)

## 2021-04-23 LAB — I-STAT CHEM 8, ED
BUN: 26 mg/dL — ABNORMAL HIGH (ref 8–23)
Calcium, Ion: 1.06 mmol/L — ABNORMAL LOW (ref 1.15–1.40)
Chloride: 104 mmol/L (ref 98–111)
Creatinine, Ser: 1.3 mg/dL — ABNORMAL HIGH (ref 0.61–1.24)
Glucose, Bld: 128 mg/dL — ABNORMAL HIGH (ref 70–99)
HCT: 48 % (ref 39.0–52.0)
Hemoglobin: 16.3 g/dL (ref 13.0–17.0)
Potassium: 4.3 mmol/L (ref 3.5–5.1)
Sodium: 140 mmol/L (ref 135–145)
TCO2: 27 mmol/L (ref 22–32)

## 2021-04-23 LAB — DIFFERENTIAL
Abs Immature Granulocytes: 0.07 10*3/uL (ref 0.00–0.07)
Basophils Absolute: 0 10*3/uL (ref 0.0–0.1)
Basophils Relative: 1 %
Eosinophils Absolute: 0 10*3/uL (ref 0.0–0.5)
Eosinophils Relative: 0 %
Immature Granulocytes: 1 %
Lymphocytes Relative: 10 %
Lymphs Abs: 0.8 10*3/uL (ref 0.7–4.0)
Monocytes Absolute: 1.1 10*3/uL — ABNORMAL HIGH (ref 0.1–1.0)
Monocytes Relative: 14 %
Neutro Abs: 6.1 10*3/uL (ref 1.7–7.7)
Neutrophils Relative %: 74 %

## 2021-04-23 LAB — CBC
HCT: 49.8 % (ref 39.0–52.0)
Hemoglobin: 16.3 g/dL (ref 13.0–17.0)
MCH: 30.9 pg (ref 26.0–34.0)
MCHC: 32.7 g/dL (ref 30.0–36.0)
MCV: 94.5 fL (ref 80.0–100.0)
Platelets: 309 10*3/uL (ref 150–400)
RBC: 5.27 MIL/uL (ref 4.22–5.81)
RDW: 13.5 % (ref 11.5–15.5)
WBC: 8.1 10*3/uL (ref 4.0–10.5)
nRBC: 0 % (ref 0.0–0.2)

## 2021-04-23 LAB — RESP PANEL BY RT-PCR (FLU A&B, COVID) ARPGX2
Influenza A by PCR: NEGATIVE
Influenza B by PCR: NEGATIVE
SARS Coronavirus 2 by RT PCR: POSITIVE — AB

## 2021-04-23 LAB — PROTIME-INR
INR: 1.2 (ref 0.8–1.2)
Prothrombin Time: 14.9 seconds (ref 11.4–15.2)

## 2021-04-23 LAB — APTT: aPTT: 29 seconds (ref 24–36)

## 2021-04-23 IMAGING — CT CT HEAD CODE STROKE
3 of 4 series · 13 of 47 positions shown, 15 images · non-contrast
Comparison: [DATE]

CLINICAL DATA: Code stroke. Neuro deficit, acute, stroke suspected.
Left facial droop and left arm tingling.

EXAM:
CT HEAD WITHOUT CONTRAST
TECHNIQUE: Contiguous axial images were obtained from the base of the skull
through the vertex without intravenous contrast.

[Series 3: head wo · axial · 0.36mm/px · z∈[+1319,+1447]mm · 7 of 36 slices shown, 9 images]
[im 5/36  brain]
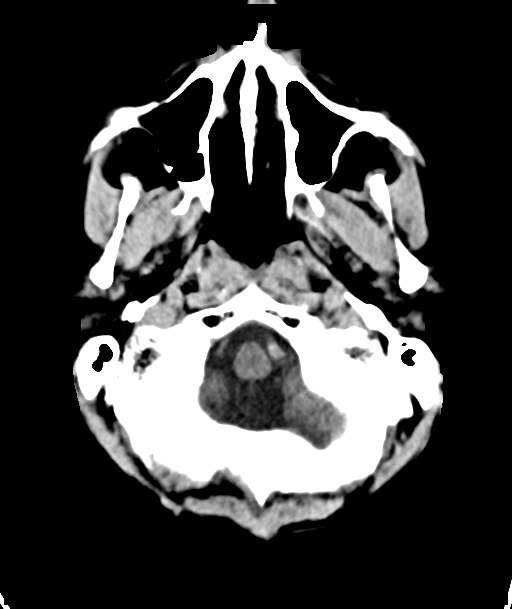
[im 5/36  bone]
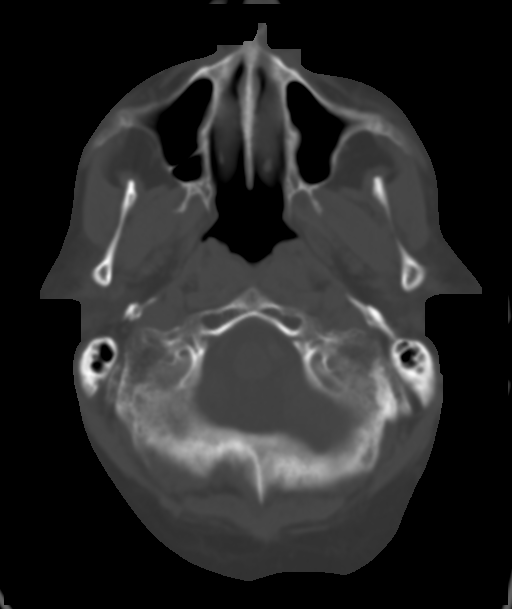
[im 9/36  brain]
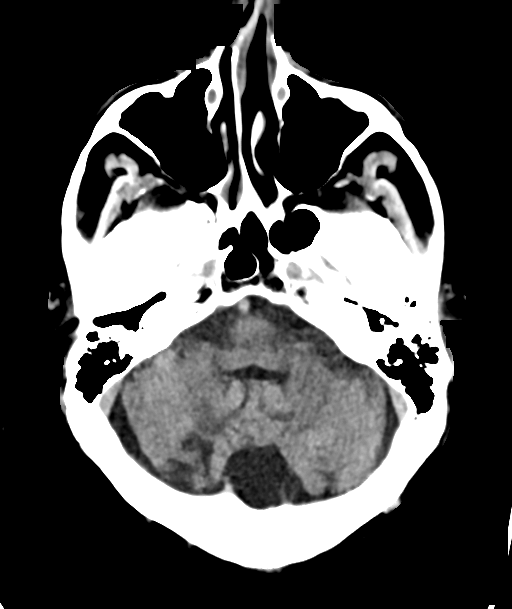
[im 14/36  brain]
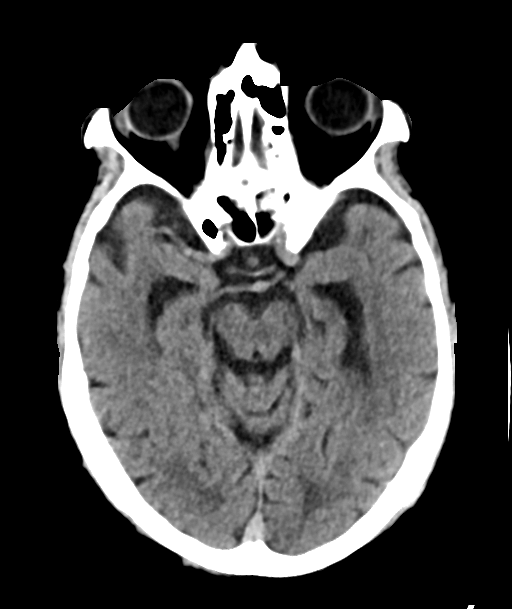
[im 18/36  brain]
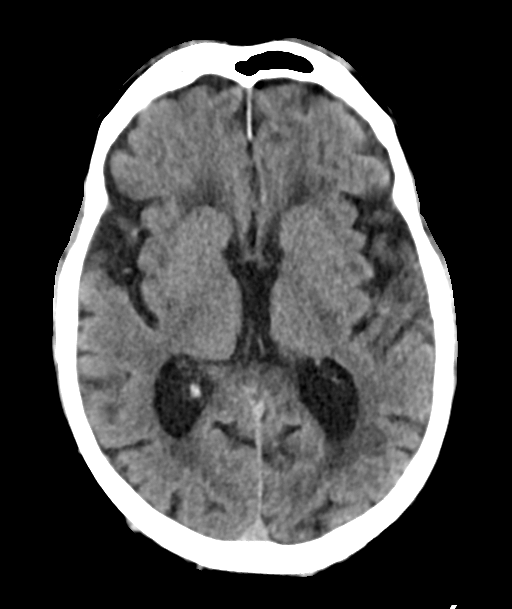
[im 22/36  brain]
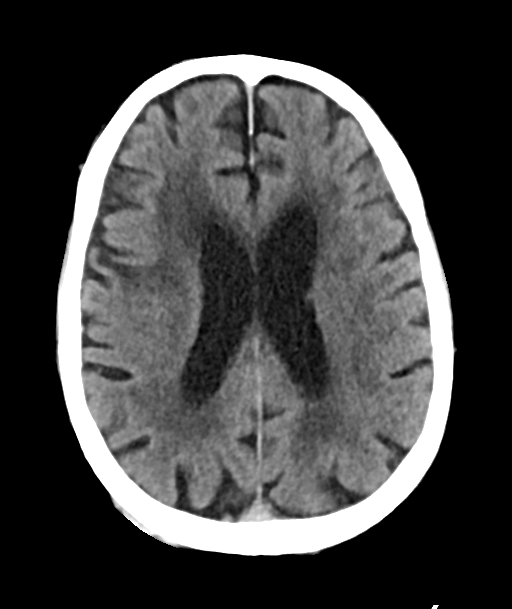
[im 22/36  bone]
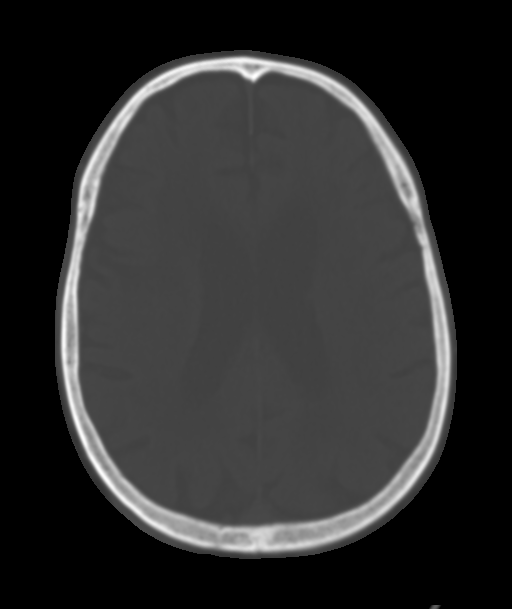
[im 27/36  brain]
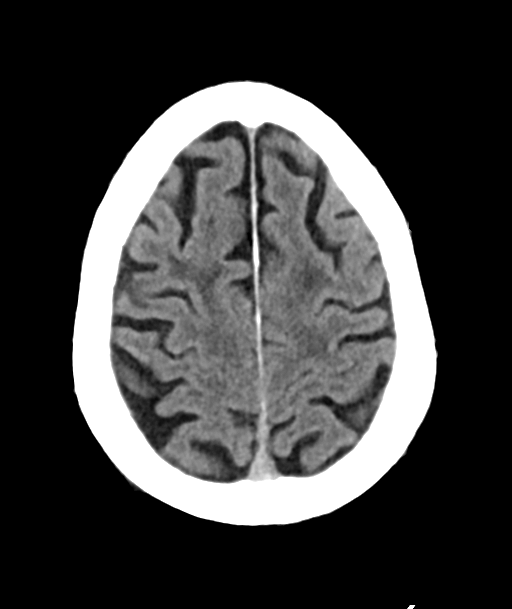
[im 31/36  brain]
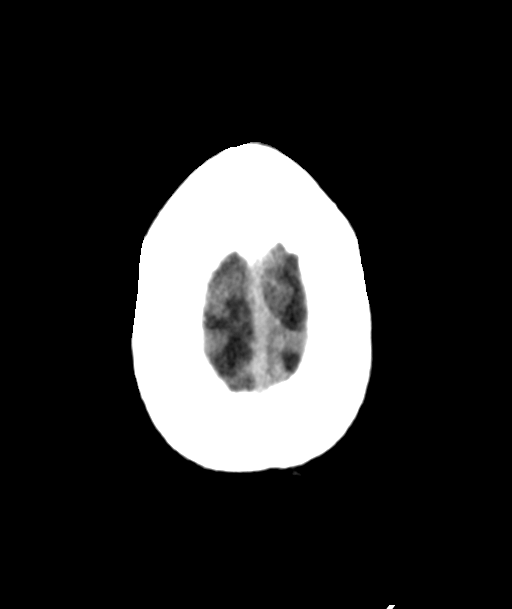

[Series 4: cor soft · coronal · 0.34mm/px · 3 of 77 slices shown]
[im 26/77  brain]
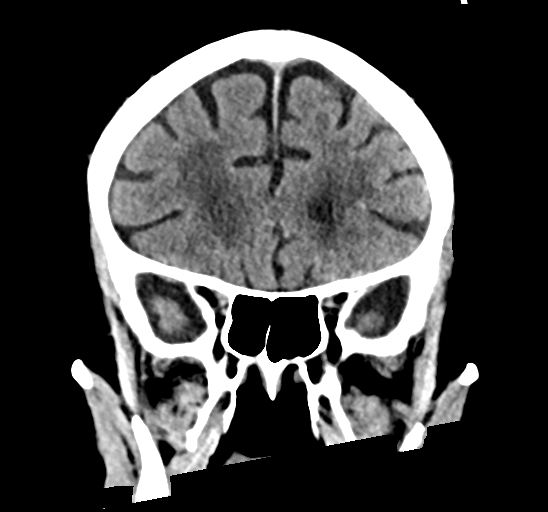
[im 34/77  brain]
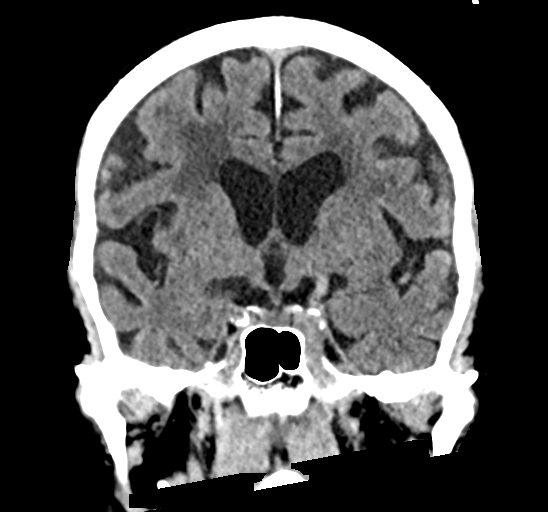
[im 43/77  brain]
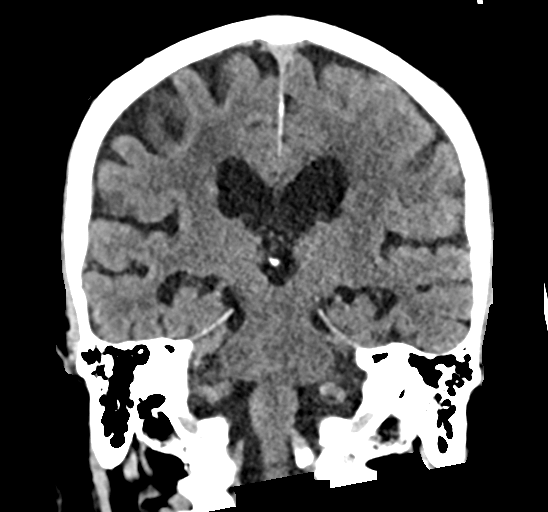

[Series 5: sag soft · sagittal · 0.33mm/px · 3 of 62 slices shown]
[im 24/62  brain]
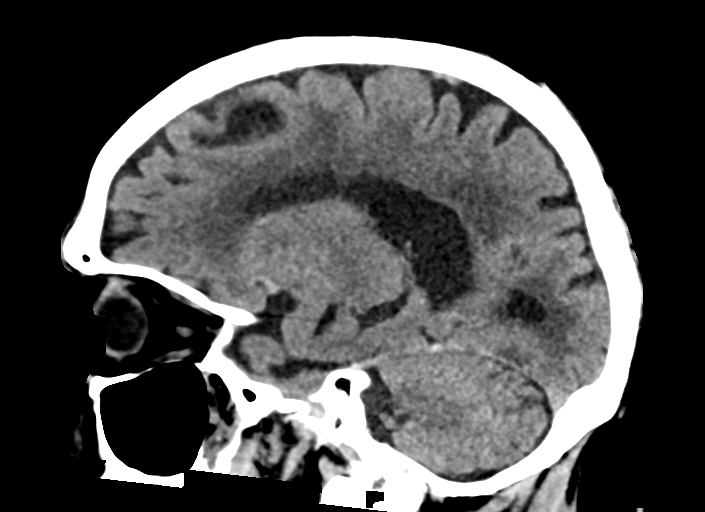
[im 31/62  brain]
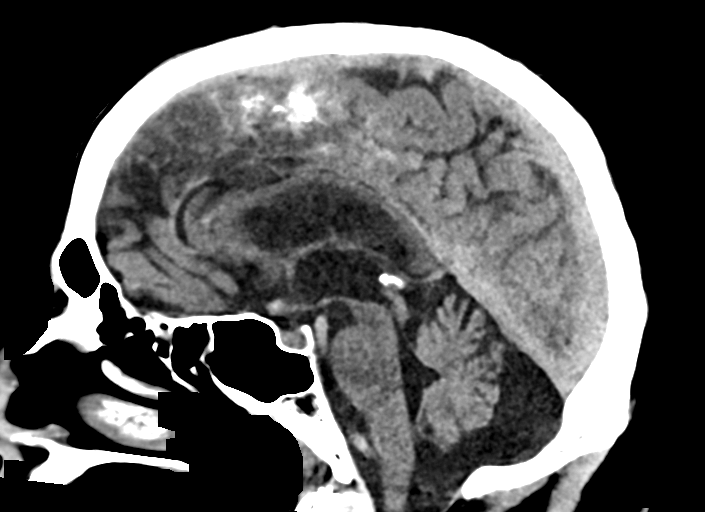
[im 38/62  brain]
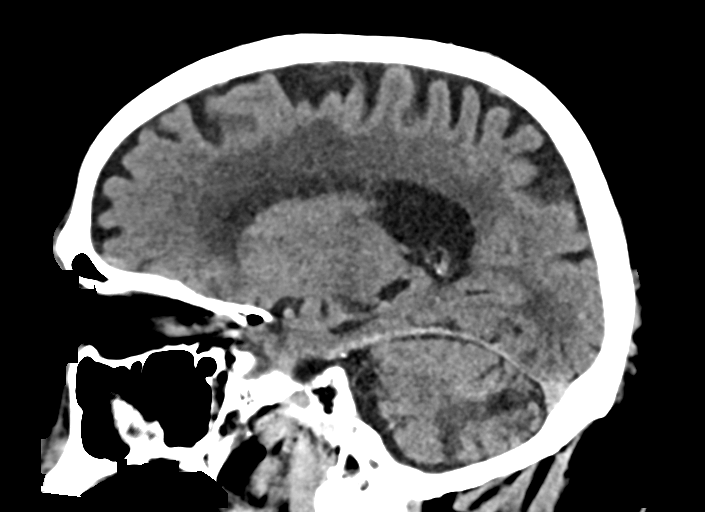

[13 of 47 positions shown; findings below may reference images not displayed]

FINDINGS: Brain: There is no evidence of an acute infarct, intracranial
hemorrhage, mass, midline shift, or extra-axial fluid collection.
There is mild-to-moderate cerebral atrophy. Patchy and confluent
hypodensities in the cerebral white matter bilaterally are unchanged
and nonspecific but compatible with extensive chronic small vessel
ischemic disease. Small chronic infarcts are again noted in the
right frontal operculum and cerebellum.

Vascular: Calcified atherosclerosis at the skull base. No hyperdense
vessel.

Skull: No fracture or suspicious osseous lesion.

Sinuses/Orbits: Mild mucosal thickening in the paranasal sinuses.
Clear mastoid air cells. Bilateral cataract extraction.

Other: None.

ASPECTS (Alberta Stroke Program Early CT Score)

- Ganglionic level infarction (caudate, lentiform nuclei, internal
capsule, insula, M1-M3 cortex): 7

- Supraganglionic infarction (M4-M6 cortex): 3

Total score (0-10 with 10 being normal): 10
IMPRESSION: 1. No evidence of acute intracranial abnormality.  ASPECTS of 10.
2. Extensive chronic small vessel ischemic disease.
3. Small chronic right frontal and cerebellar infarcts.

These results were communicated to Dr. WILHO at [DATE] on
[DATE] by text page via the [employer] messaging system.

## 2021-04-23 IMAGING — MR MR MRA HEAD W/O CM
2 series · 20 of 48 positions shown · non-contrast
Comparison: Head CT and CTA [DATE].  Head MRI [DATE].

CLINICAL DATA: Stroke, follow up. Left-sided weakness and facial
droop.

EXAM:
MRI HEAD WITHOUT CONTRAST
MRA HEAD WITHOUT CONTRAST
TECHNIQUE: Multiplanar, multi-echo pulse sequences of the brain and surrounding
structures were acquired without intravenous contrast. Angiographic
images of the Circle of Willis were acquired using MRA technique
without intravenous contrast.

[Series 9: 3d cow · axial · 0.5mm · 0.41mm/px · z∈[-127,-49]mm · 10 of 172 slices shown (1 of 2)]
[im 8/172]
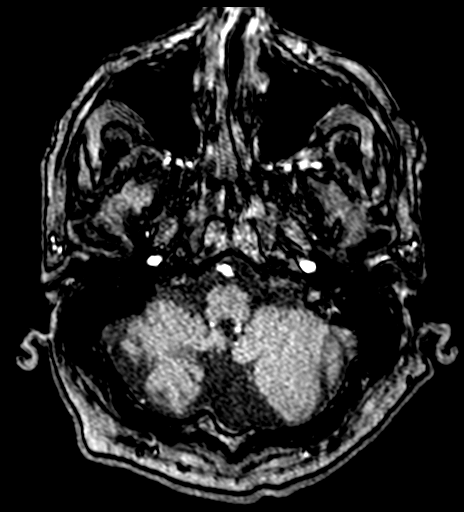
[im 30/172]
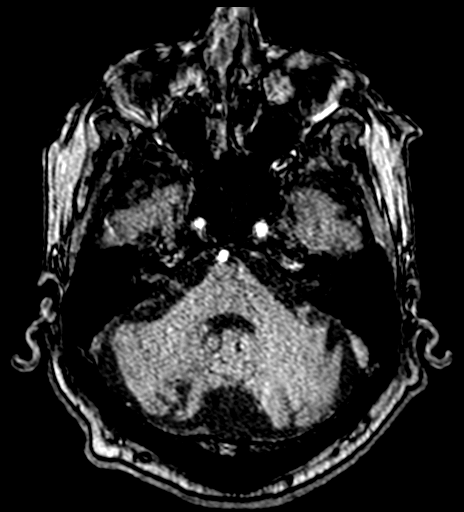
[im 53/172]
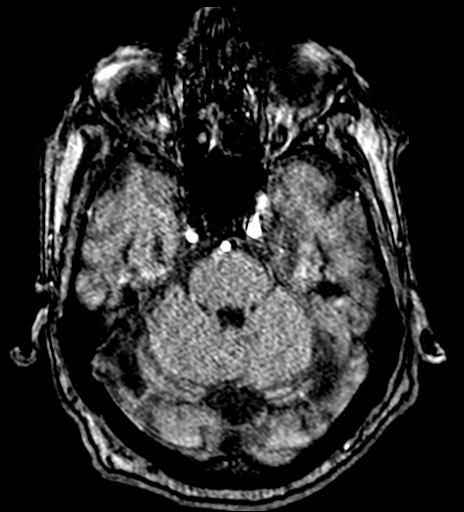
[im 75/172]
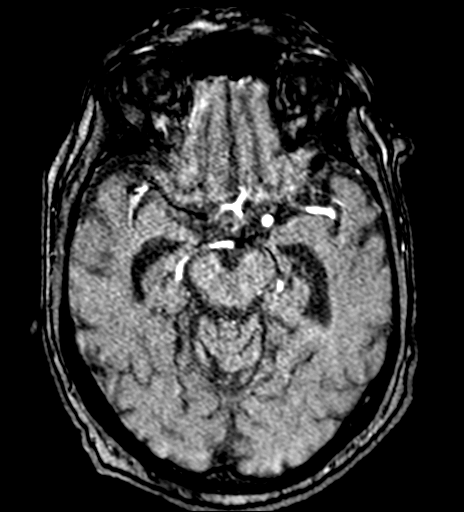
[im 90/172]
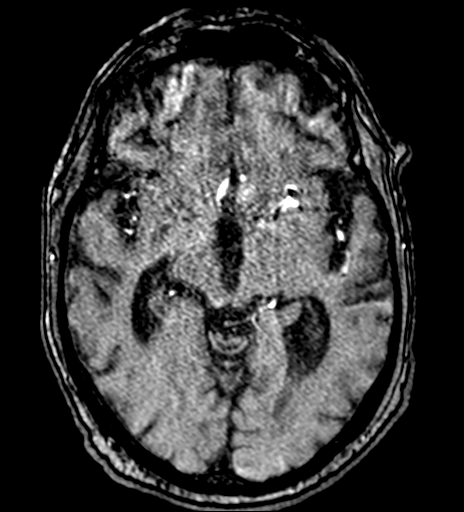
[im 97/172]
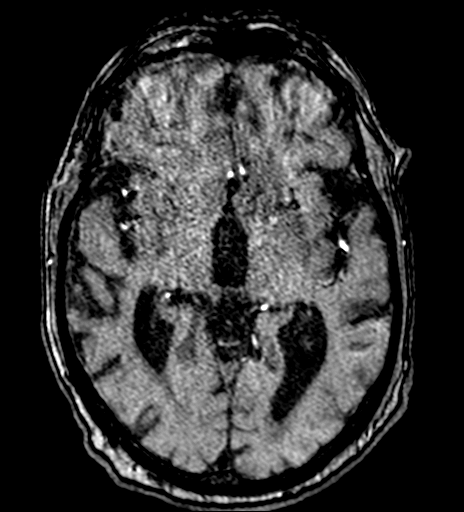
[im 119/172]
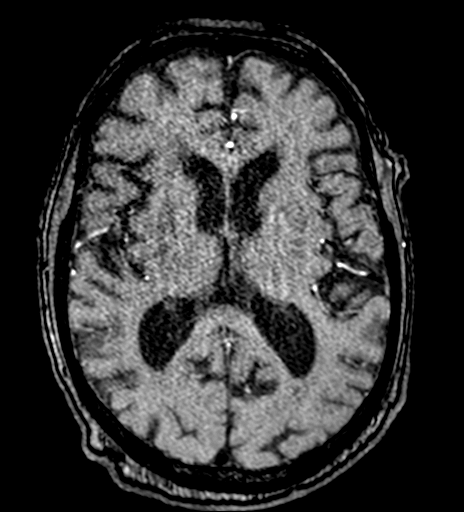
[im 142/172]
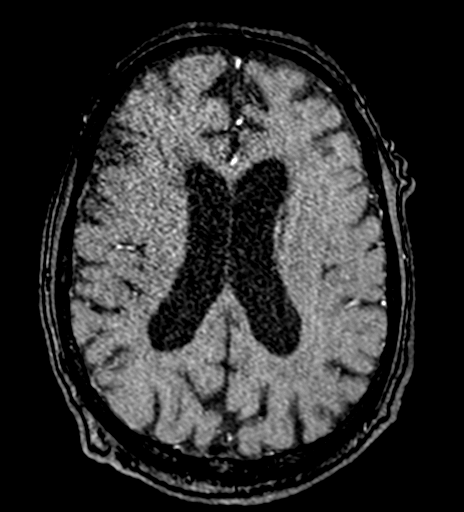
[im 149/172]
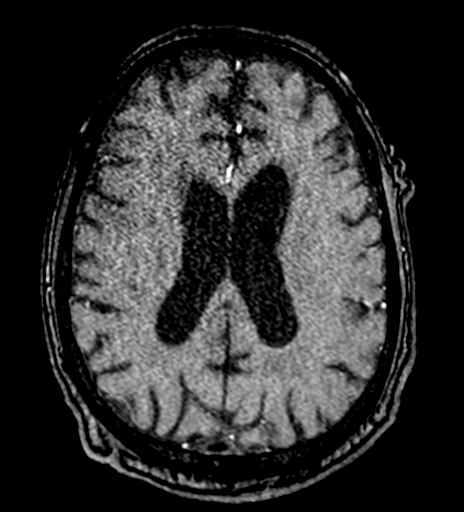
[im 164/172]
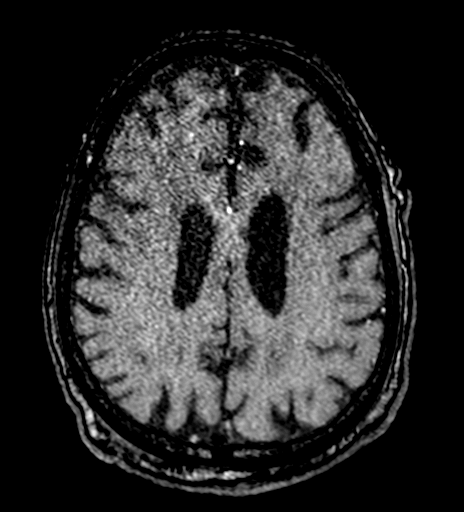

[Series 1073: 3d cow · axial · 0.5mm · 0.41mm/px · z∈[-127,-50]mm · 10 of 172 slices shown (2 of 2)]
[im 8/172]
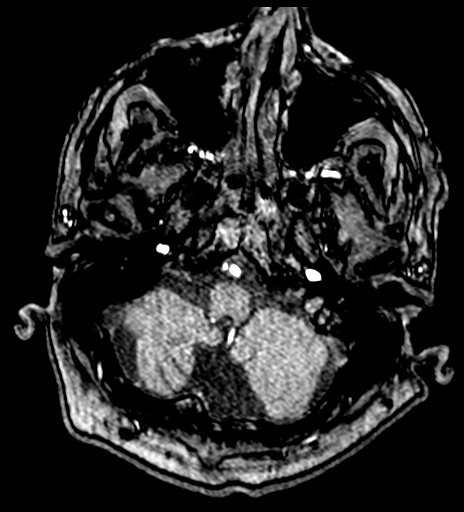
[im 30/172]
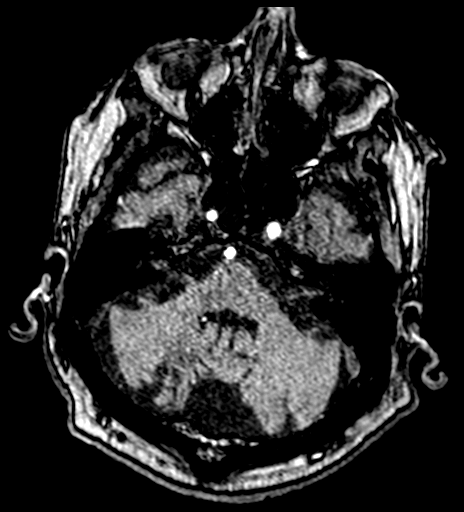
[im 53/172]
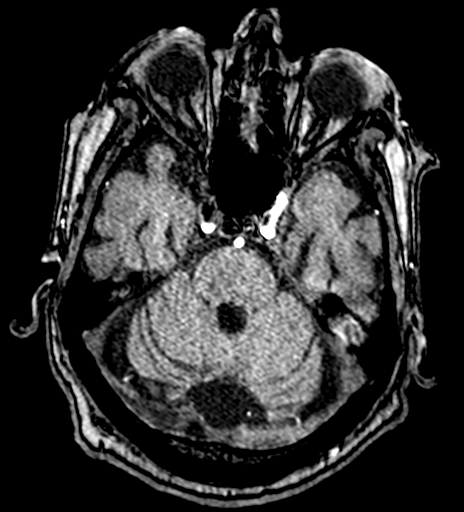
[im 75/172]
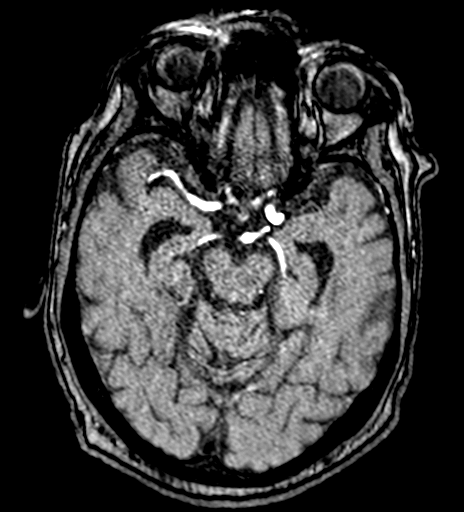
[im 90/172]
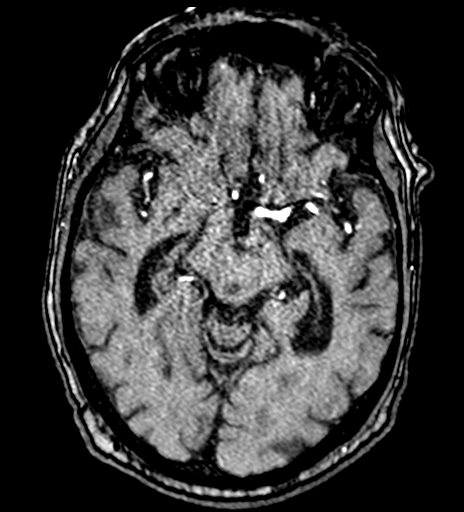
[im 97/172]
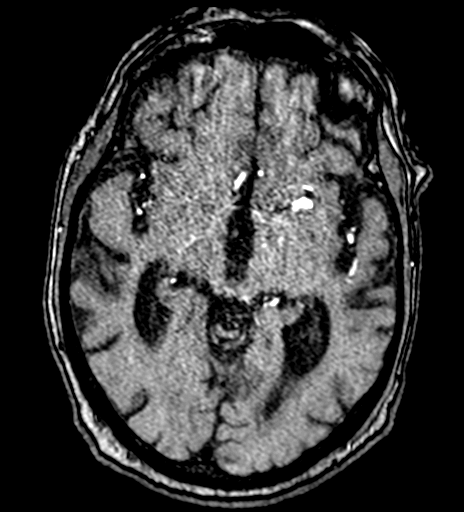
[im 119/172]
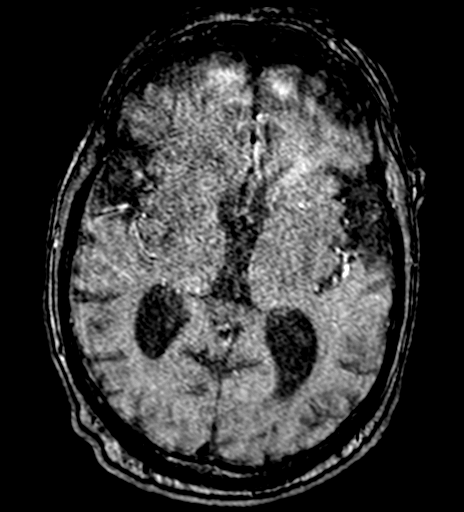
[im 142/172]
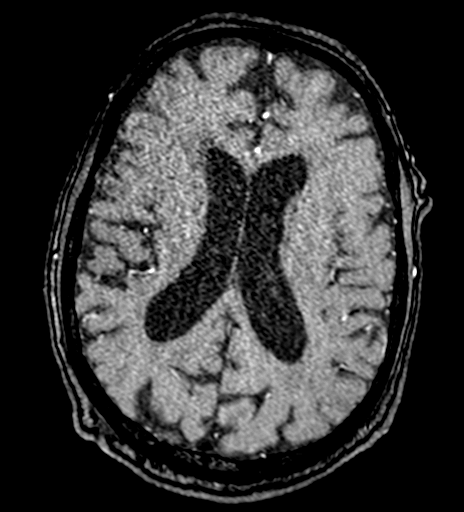
[im 149/172]
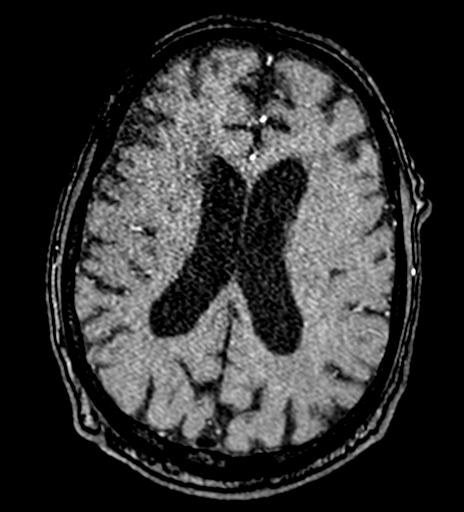
[im 164/172]
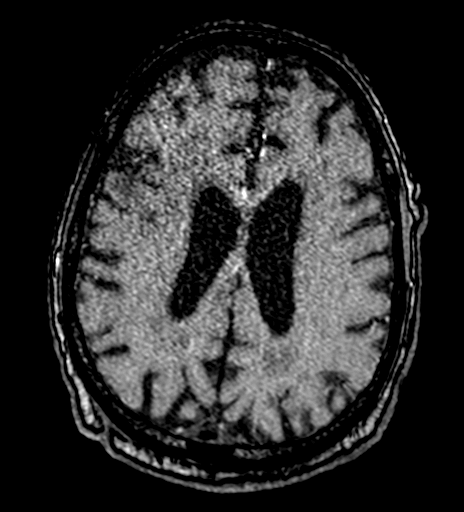

[20 of 48 positions shown; findings below may reference images not displayed]

FINDINGS: MRI HEAD FINDINGS

The study is intermittently up to moderately motion degraded.

Brain: There is no evidence of an acute infarct, intracranial
hemorrhage, mass, midline shift, or extra-axial fluid collection.
Small chronic infarcts are again noted in the right frontal lobe and
right cerebellar hemisphere. T2 hyperintensities elsewhere in the
cerebral white matter bilaterally have mildly progressed from the
prior MRI and are nonspecific but compatible with extensive chronic
small vessel ischemic disease. There is moderate cerebral atrophy.

Vascular: Major intracranial vascular flow voids are preserved.

Skull and upper cervical spine: Unremarkable bone marrow signal.

Sinuses/Orbits: Bilateral cataract extraction. Mild mucosal
thickening in the paranasal sinuses. Small left mastoid effusion.

Other: None.

MRA HEAD FINDINGS

The study is moderately motion degraded despite repeat imaging.

Anterior circulation: Internal carotid arteries are patent from
skull base to carotid termini with motion artifact limiting
assessment for stenosis though with only mild right-sided cavernous
stenosis demonstrated on today's CTA. The right A1 segment is
absent. The ACAs and MCAs are patent without a gross proximal branch
occlusion or high-grade proximal stenosis although assessment is
limited by motion artifact. A 2 mm left MCA bifurcation aneurysm was
better demonstrated on today's CTA.

Posterior circulation: Only the distal most V4 segments were
included and are patent with the left being dominant. The basilar
artery is widely patent. There is a patent left posterior
communicating artery. Both PCAs are patent without evidence of a
significant proximal stenosis. Short-segment signal loss involving
the proximal left P2 segment on the second acquisition is
artifactual. No aneurysm is identified.

Anatomic variants: Absent right A1.
IMPRESSION: 1. No acute intracranial abnormality.
2. Extensive chronic small vessel ischemic disease.
3. Motion degraded head MRA without large vessel occlusion.

## 2021-04-23 IMAGING — MR MR HEAD W/O CM
13 of 14 series · 44 of 48 positions shown · non-contrast
Comparison: Head CT and CTA [DATE].  Head MRI [DATE].

CLINICAL DATA: Stroke, follow up. Left-sided weakness and facial
droop.

EXAM:
MRI HEAD WITHOUT CONTRAST
MRA HEAD WITHOUT CONTRAST
TECHNIQUE: Multiplanar, multi-echo pulse sequences of the brain and surrounding
structures were acquired without intravenous contrast. Angiographic
images of the Circle of Willis were acquired using MRA technique
without intravenous contrast.

[Series 9: DWI · axial · 3.0mm · 0.88mm/px · z∈[-140,+12]mm · 7 of 104 slices shown (1 of 4)]
[im 1/104]
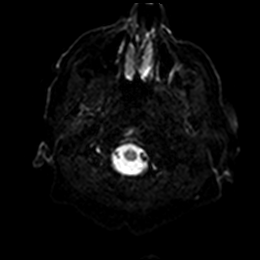
[im 18/104]
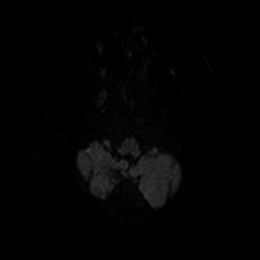
[im 35/104]
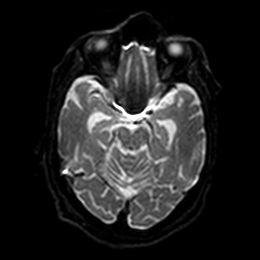
[im 52/104]
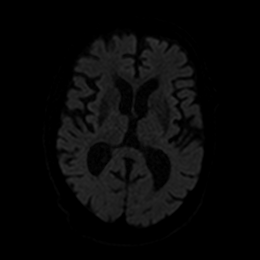
[im 69/104]
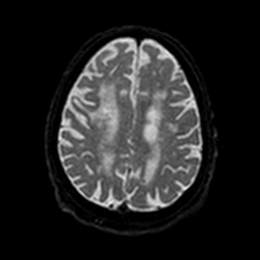
[im 86/104]
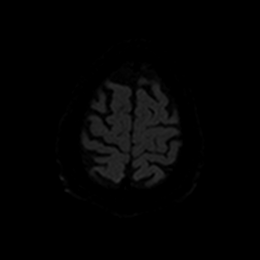
[im 104/104]
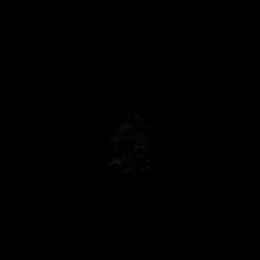

[Series 10: DWI · axial · 3.0mm · 0.88mm/px · z∈[-140,+12]mm · 4 of 52 slices shown (2 of 4)]
[im 1/52]
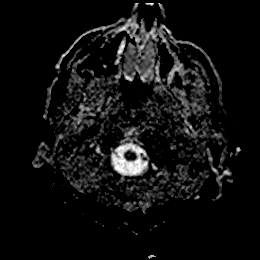
[im 18/52]
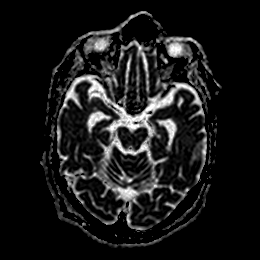
[im 35/52]
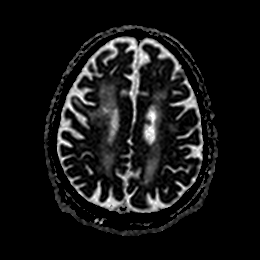
[im 52/52]
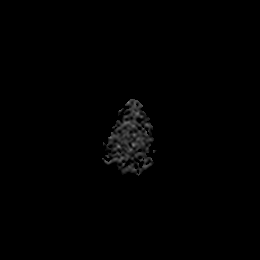

[Series 11: DWI · coronal · 4.0mm · 0.88mm/px · 5 of 76 slices shown (3 of 4)]
[im 1/76]
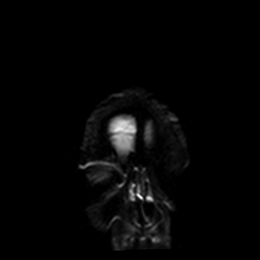
[im 19/76]
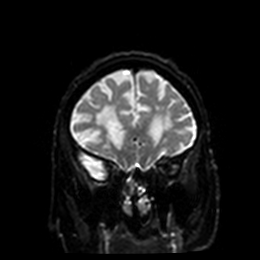
[im 38/76]
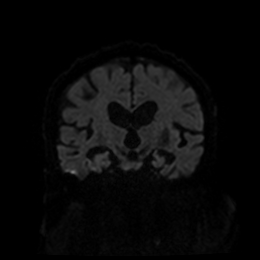
[im 57/76]
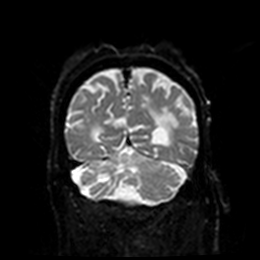
[im 76/76]
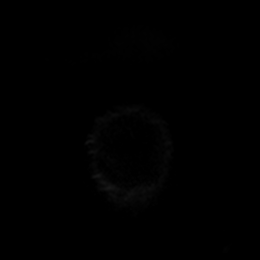

[Series 12: DWI · coronal · 4.0mm · 0.88mm/px · 3 of 38 slices shown (4 of 4)]
[im 1/38]
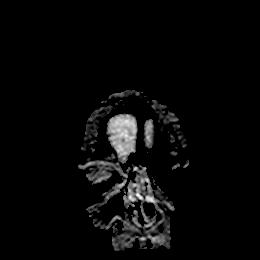
[im 19/38]
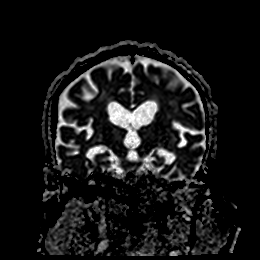
[im 38/38]
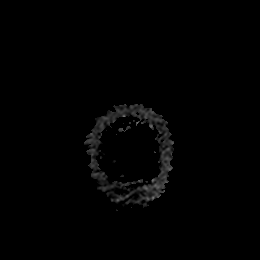

[Series 13: FLAIR · axial · 5.0mm · 0.45mm/px · z∈[-137,+6]mm · 2 of 25 slices shown (1 of 2)]
[im 1/25]
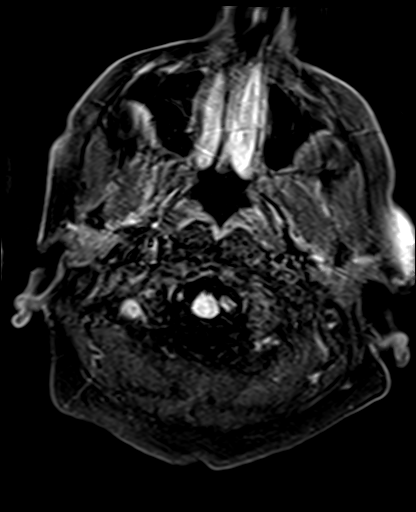
[im 25/25]
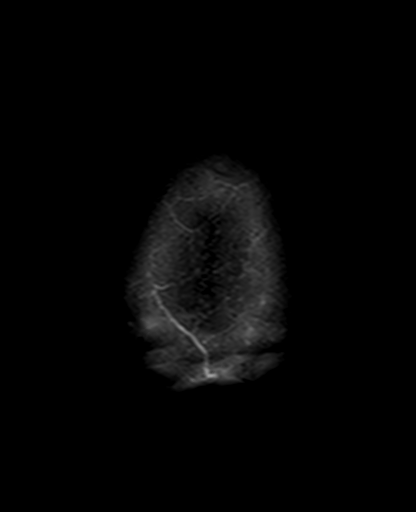

[Series 14: mag_images · axial · 3.0mm · 0.94mm/px · z∈[-148,+16]mm · 4 of 56 slices shown]
[im 1/56]
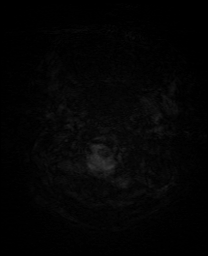
[im 19/56]
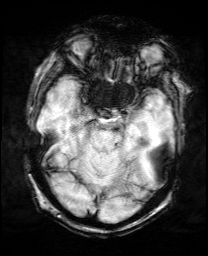
[im 37/56]
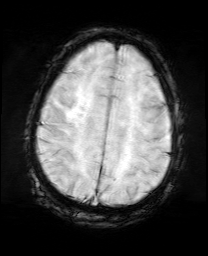
[im 56/56]
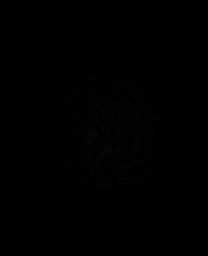

[Series 15: pha_images · axial · 3.0mm · 0.94mm/px · z∈[-148,+7]mm · 4 of 53 slices shown]
[im 1/53]
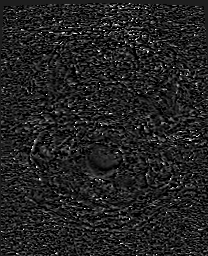
[im 18/53]
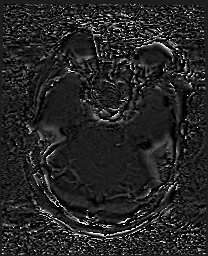
[im 35/53]
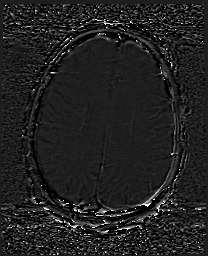
[im 53/53]
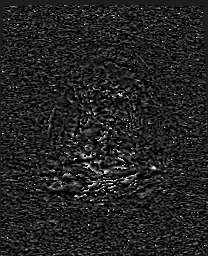

[Series 16: swi_images · axial · 3.0mm · 0.94mm/px · z∈[-148,+16]mm · 4 of 56 slices shown]
[im 1/56]
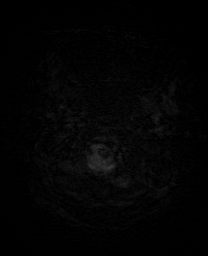
[im 19/56]
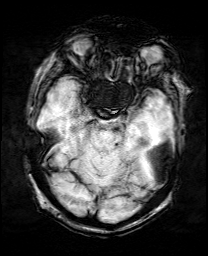
[im 37/56]
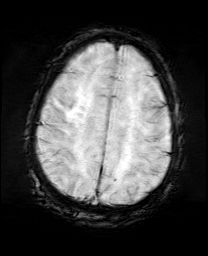
[im 56/56]
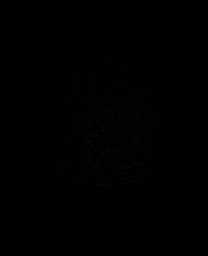

[Series 17: mip_images(sw) · axial · 24.0mm · 0.94mm/px · z∈[-137,+6]mm · 3 of 49 slices shown]
[im 1/49]
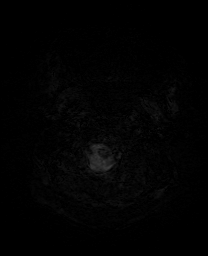
[im 25/49]
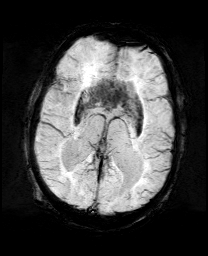
[im 49/49]
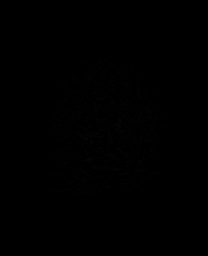

[Series 22: T1 · sagittal · 5.0mm · 0.75mm/px · 2 of 25 slices shown]
[im 1/25]
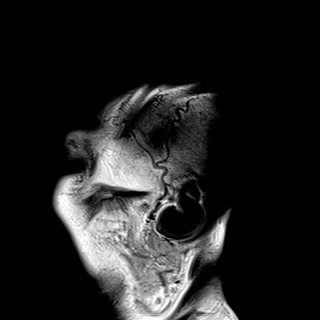
[im 25/25]
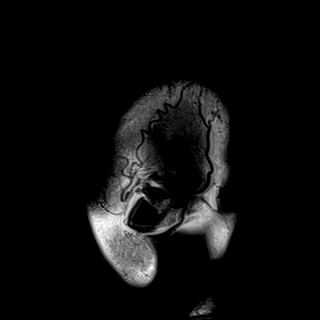

[Series 23: T2 · axial · 5.0mm · 0.76mm/px · z∈[-136,+8]mm · 2 of 25 slices shown (1 of 2)]
[im 1/25]
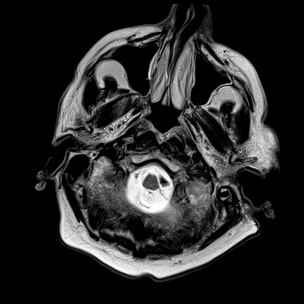
[im 25/25]
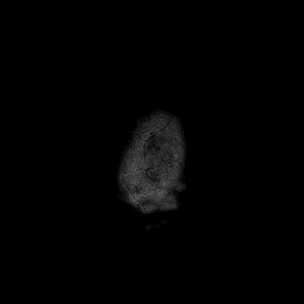

[Series 25: T2 · coronal · 5.0mm · 0.76mm/px · 2 of 30 slices shown (2 of 2)]
[im 1/30]
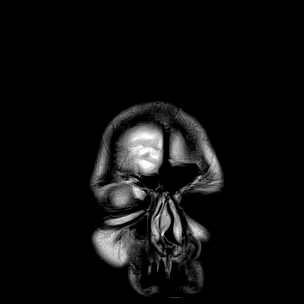
[im 30/30]
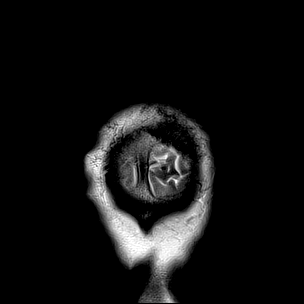

[Series 26: FLAIR · axial · 5.0mm · 0.90mm/px · z∈[-136,+8]mm · 2 of 25 slices shown (2 of 2)]
[im 1/25]
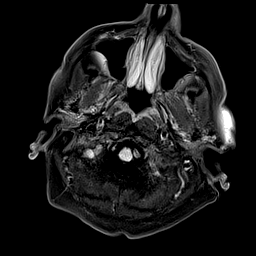
[im 25/25]
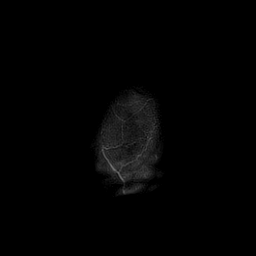

[44 of 48 positions shown; findings below may reference images not displayed]

FINDINGS: MRI HEAD FINDINGS

The study is intermittently up to moderately motion degraded.

Brain: There is no evidence of an acute infarct, intracranial
hemorrhage, mass, midline shift, or extra-axial fluid collection.
Small chronic infarcts are again noted in the right frontal lobe and
right cerebellar hemisphere. T2 hyperintensities elsewhere in the
cerebral white matter bilaterally have mildly progressed from the
prior MRI and are nonspecific but compatible with extensive chronic
small vessel ischemic disease. There is moderate cerebral atrophy.

Vascular: Major intracranial vascular flow voids are preserved.

Skull and upper cervical spine: Unremarkable bone marrow signal.

Sinuses/Orbits: Bilateral cataract extraction. Mild mucosal
thickening in the paranasal sinuses. Small left mastoid effusion.

Other: None.

MRA HEAD FINDINGS

The study is moderately motion degraded despite repeat imaging.

Anterior circulation: Internal carotid arteries are patent from
skull base to carotid termini with motion artifact limiting
assessment for stenosis though with only mild right-sided cavernous
stenosis demonstrated on today's CTA. The right A1 segment is
absent. The ACAs and MCAs are patent without a gross proximal branch
occlusion or high-grade proximal stenosis although assessment is
limited by motion artifact. A 2 mm left MCA bifurcation aneurysm was
better demonstrated on today's CTA.

Posterior circulation: Only the distal most V4 segments were
included and are patent with the left being dominant. The basilar
artery is widely patent. There is a patent left posterior
communicating artery. Both PCAs are patent without evidence of a
significant proximal stenosis. Short-segment signal loss involving
the proximal left P2 segment on the second acquisition is
artifactual. No aneurysm is identified.

Anatomic variants: Absent right A1.
IMPRESSION: 1. No acute intracranial abnormality.
2. Extensive chronic small vessel ischemic disease.
3. Motion degraded head MRA without large vessel occlusion.

## 2021-04-23 IMAGING — DX DG CHEST 1V PORT
1 series · 1 of 1 positions shown · non-contrast
Comparison: Chest x-ray [DATE].

CLINICAL DATA: COVID.

EXAM:
PORTABLE CHEST 1 VIEW

[chest]
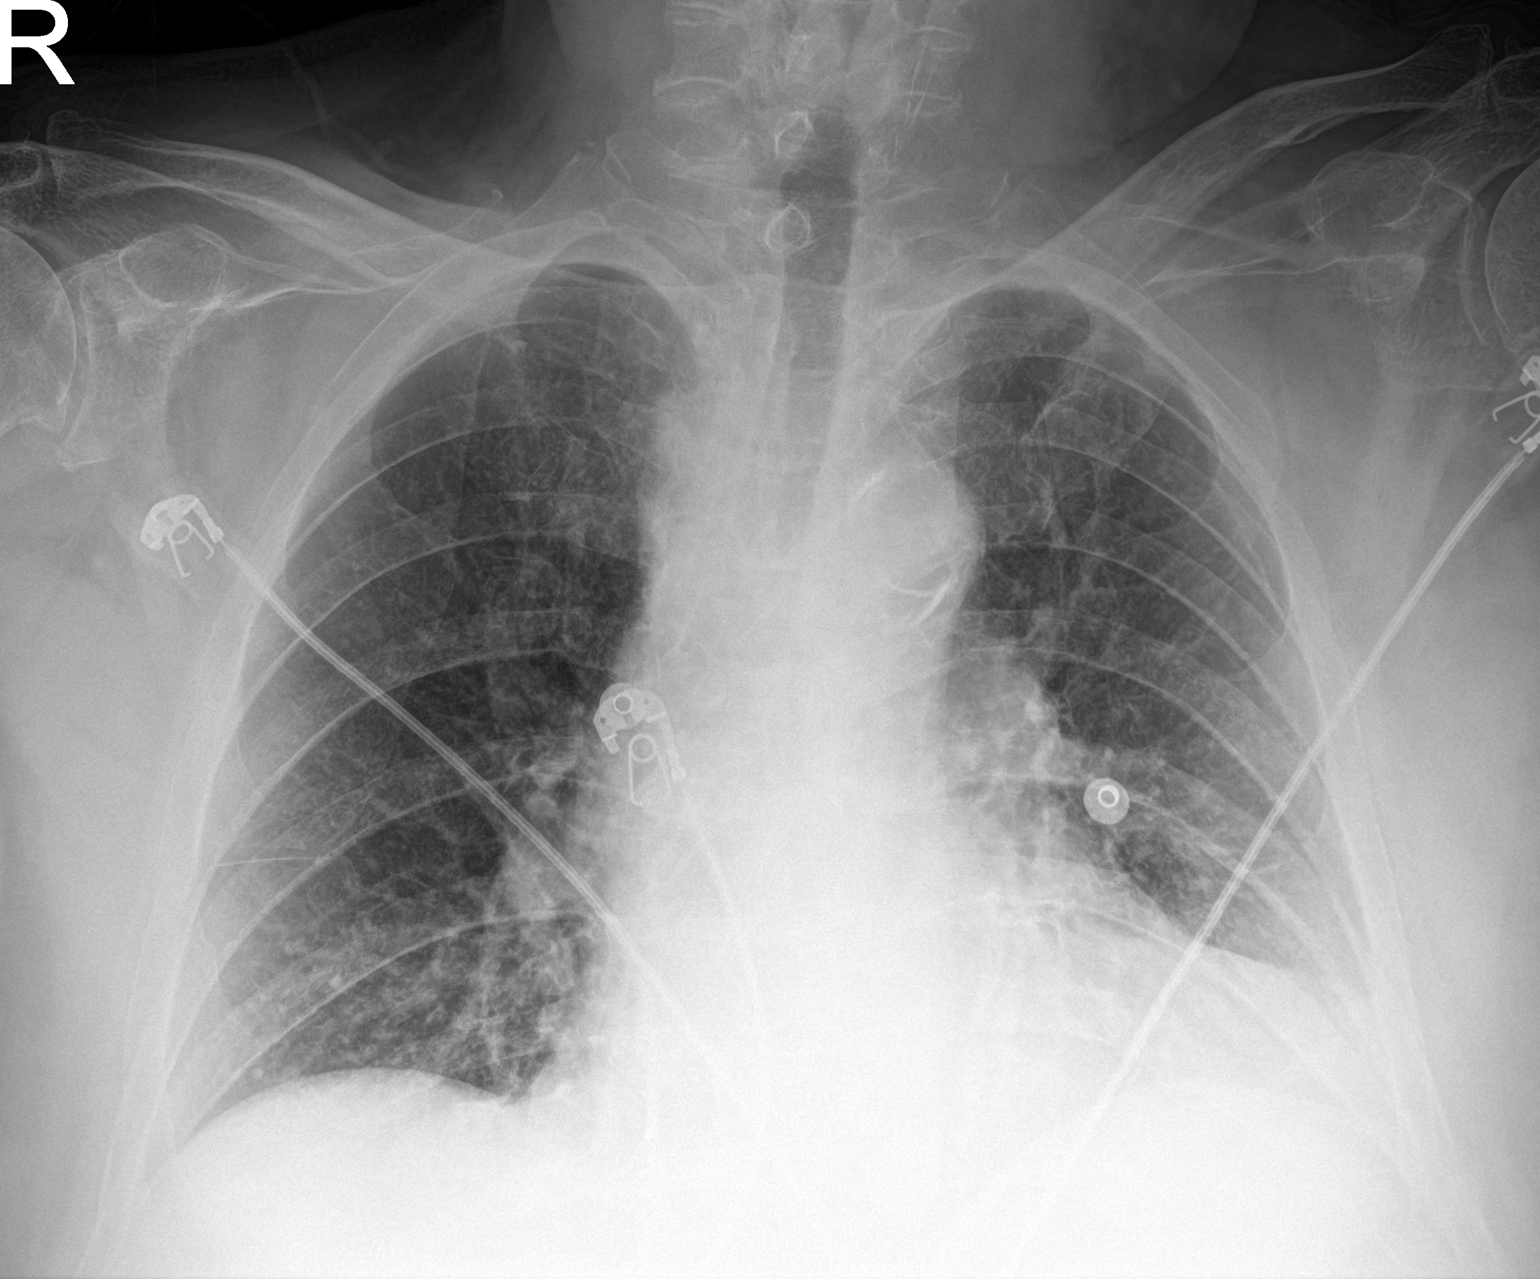

[1 of 1 positions shown; findings below may reference images not displayed]

FINDINGS: The aorta is tortuous with atherosclerotic calcifications. The heart
is enlarged, unchanged. There are minimal strandy opacities in the
left lung base. There is no pleural effusion or pneumothorax
identified. No acute fractures are seen.
IMPRESSION: 1. Minimal left basilar atelectasis.
2. Stable cardiomegaly.
3.  Aortic Atherosclerosis ([AZ]-[AZ]).

## 2021-04-23 MED ORDER — ENOXAPARIN SODIUM 40 MG/0.4ML IJ SOSY
40.0000 mg | PREFILLED_SYRINGE | INTRAMUSCULAR | Status: DC
  Administered 2021-04-23 – 2021-04-27 (×5): 40 mg via SUBCUTANEOUS
  Filled 2021-04-23 (×5): qty 0.4

## 2021-04-23 MED ORDER — IOHEXOL 350 MG/ML SOLN
100.0000 mL | Freq: Once | INTRAVENOUS | Status: AC | PRN
  Administered 2021-04-23: 100 mL via INTRAVENOUS

## 2021-04-23 MED ORDER — ASPIRIN EC 325 MG PO TBEC: 325.0000 mg | DELAYED_RELEASE_TABLET | Freq: Once | ORAL | Status: DC

## 2021-04-23 MED ORDER — INSULIN ASPART 100 UNIT/ML IJ SOLN
0.0000 [IU] | INTRAMUSCULAR | Status: DC
Start: 2021-04-23 — End: 2021-04-28
  Administered 2021-04-24: 2 [IU] via SUBCUTANEOUS
  Administered 2021-04-25: 1 [IU] via SUBCUTANEOUS
  Administered 2021-04-25 (×2): 2 [IU] via SUBCUTANEOUS
  Administered 2021-04-25: 1 [IU] via SUBCUTANEOUS
  Administered 2021-04-25: 2 [IU] via SUBCUTANEOUS
  Administered 2021-04-26: 1 [IU] via SUBCUTANEOUS
  Administered 2021-04-26 (×2): 2 [IU] via SUBCUTANEOUS
  Administered 2021-04-27 (×2): 1 [IU] via SUBCUTANEOUS
  Administered 2021-04-27: 2 [IU] via SUBCUTANEOUS
  Administered 2021-04-27: 3 [IU] via SUBCUTANEOUS
  Administered 2021-04-27 (×2): 2 [IU] via SUBCUTANEOUS
  Administered 2021-04-28 (×2): 1 [IU] via SUBCUTANEOUS

## 2021-04-23 MED ORDER — STROKE: EARLY STAGES OF RECOVERY BOOK
Freq: Once | Status: AC
  Filled 2021-04-23: qty 1

## 2021-04-23 MED ORDER — ASPIRIN EC 81 MG PO TBEC: 81.0000 mg | DELAYED_RELEASE_TABLET | Freq: Every day | ORAL | Status: DC

## 2021-04-23 MED ORDER — SODIUM CHLORIDE 0.9 % IV SOLN
200.0000 mg | Freq: Once | INTRAVENOUS | Status: AC
  Administered 2021-04-23: 200 mg via INTRAVENOUS
  Filled 2021-04-23: qty 40

## 2021-04-23 MED ORDER — LORAZEPAM 2 MG/ML IJ SOLN
1.0000 mg | Freq: Once | INTRAMUSCULAR | Status: AC
  Administered 2021-04-23: 1 mg via INTRAVENOUS
  Filled 2021-04-23: qty 1

## 2021-04-23 MED ORDER — ACETAMINOPHEN 325 MG PO TABS: 650.0000 mg | ORAL_TABLET | ORAL | Status: DC | PRN

## 2021-04-23 MED ORDER — LACTATED RINGERS IV SOLN: INTRAVENOUS | Status: DC

## 2021-04-23 MED ORDER — ACETAMINOPHEN 650 MG RE SUPP
650.0000 mg | RECTAL | Status: DC | PRN
  Filled 2021-04-23: qty 1

## 2021-04-23 MED ORDER — ACETAMINOPHEN 160 MG/5ML PO SOLN: 650.0000 mg | ORAL | Status: DC | PRN

## 2021-04-23 MED ORDER — SODIUM CHLORIDE 0.9 % IV SOLN
100.0000 mg | Freq: Every day | INTRAVENOUS | Status: AC
  Administered 2021-04-24 – 2021-04-27 (×4): 100 mg via INTRAVENOUS
  Filled 2021-04-23 (×4): qty 20

## 2021-04-23 NOTE — ED Triage Notes (Signed)
Pt BIB [facility] as a code stroke. LKN of 1345. Per family at 1230 patient able to ambulate and was completely at baseline but he started to develop L sided deficits- left facial droop and left arm weakness with arm tingling. Pt is nonverbal at baseline as he uses [place] sign language. VSS w/ EMS. Taken to CT 1 for stroke eval.

## 2021-04-23 NOTE — ED Notes (Signed)
Patient transported to MRI 

## 2021-04-23 NOTE — ED Notes (Signed)
MRI attempted to use translation machine with patient to answer safety questions. Patient would not respond to the ASL translator. Patient pulling at IV when this RN went to see patient. PA made aware. 1mg  Ativan given to get MRI done.

## 2021-04-23 NOTE — Code Documentation (Signed)
Stroke Response Nurse Documentation Code Documentation  Bobby Rojas is a 87 y.o. male arrived to [facility] via [facility] on 04/23/21 with complaints of left facial droop and left arm tingling. 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 Patient on No antithrombotic.  Stroke team at the bedside on patient arrival, cleared for CT by EDP Long. NIHSS 4, see documentation for details and code stroke times. Patient disoriented, left facial droop, left decreased sensation, and Sensory  neglect on exam. The following imaging was completed: CT, CTA head and neck, CTP, although CTA/CTP unable to be completed as pt not able to stay still due to coughing. Patient is not a candidate for IV thrombolytic due to too mild to treat and improving on exam.    Care/Plan: q2h vitals and mNIHSS per [facility] and MRI. Bedside handoff with RN Emilee.     Scarlette Slice K  Stroke Response RN

## 2021-04-23 NOTE — H&P (Addendum)
History and Physical    Issac Moure YQI:[id number] DOB: August 29, 1933 DOA: 04/23/2021  PCP: Jolene Provost, MD  Patient coming from: [facility]  I have personally briefly reviewed patient's old medical records in [facility] Link  Chief Complaint: L sided weakness  HPI: Bobby Rojas is a 87 y.o. male with medical history significant of Deafness, CAD, DM2, HTN, HLD, dementia..  Per daughter, at baseline: pt with advancing dementia.  Only person who can communicate with him routinely is his son-in-law who has to be present in person.  Pt used to do okay with ASL interpreter over a monitor but now doesn't really do well with video interpreters anymore.  Now a days answers most questions even to son-in-law as yes/no.  Never has done well with lip reading.  Pt sent in from SNF today after they noticed he had L arm weakness and L sided facial droop.  Report that patient had L arm tingling as well.  Was able to get him to use ASL interpreter on video initially in ED, per interpreter pt also using L hand less than his right.  Pt noted to be disoriented (unclear if this baseline or not).  NIHSS 4 initially.  Seen as code stroke.   ED Course: work up in ED: MRI neg for acute stroke.  CTA does show high grade R ICA origin stenosis  Pt not doing well later on with the ASL interpreter over monitor.  Given ativan for MRI.  COVID+  Pt is vaccinated per daughter.  Satting 99% on RA  CXR neg.  Tm 99.2   Review of Systems: Unable to perform due to dementia / AMS / language barrier.  Past Medical History:  Diagnosis Date   Atherosclerosis    CAD (coronary artery disease)    Deaf    bilateral   Depression    Diabetes mellitus without complication (HCC)    Gastroenteritis    Hypercholesterolemia    Hypertension    Hypothyroid    MCI (mild cognitive impairment)    OSA (obstructive sleep apnea)     Past Surgical History:  Procedure Laterality Date   BLADDER SURGERY     CATARACT  EXTRACTION, BILATERAL     ELBOW FRACTURE SURGERY     KNEE SURGERY Right      reports that he has quit smoking. His smoking use included cigars. He has never used smokeless tobacco. He reports that he does not currently use alcohol. He reports that he does not use drugs.  No Known Allergies  No family history on file. Daughter is alive and well, pt unable to provide much in the way of family history due to dementia, AMS, and language barrier.  Prior to Admission medications   Medication Sig Start Date End Date Taking? Authorizing Provider  atorvastatin (LIPITOR) 40 MG tablet TAKE 1 TABLET BY MOUTH  DAILY 08/23/18   [provider]  Cholecalciferol (VITAMIN D3) 25 MCG (1000 UT) CAPS Take by mouth.    [provider]  citalopram (CELEXA) 40 MG tablet TAKE 1 TABLET BY MOUTH  EVERY MORNING 11/12/18   [provider]  famotidine (PEPCID) 20 MG tablet Take by mouth. 10/19/18   [provider]  levothyroxine (SYNTHROID) 75 MCG tablet TAKE 1 TABLET BY MOUTH  DAILY AT 6AM 08/01/15   [provider]  lisinopril (ZESTRIL) 20 MG tablet TK 1 T PO QD 10/14/18   [provider]  memantine (NAMENDA) 5 MG tablet Take by mouth. 11/12/18  [provider]  metFORMIN (GLUCOPHAGE-XR) 500 MG 24 hr tablet Take 1,000 mg by mouth 2 (two) times daily. 10/14/18   [provider]  Multiple Vitamin (MULTIVITAMIN) tablet Take by mouth.    [provider]  rOPINIRole (REQUIP) 0.5 MG tablet  10/29/18   [provider]    Physical Exam: Vitals:   04/23/21 1915 04/23/21 2045 04/23/21 2115 04/23/21 2145  BP: (!) 156/80 135/67 140/87 (!) 163/73  Pulse: 73 77 79 79  Resp: 14 19 (!) 29 (!) 21  Temp:      TempSrc:      SpO2: 99% 99% 97% 99%  Weight:      Height:        Constitutional: NAD, calm, comfortable Eyes: PERRL, lids and conjunctivae normal ENMT: Mucous membranes are moist. Posterior pharynx clear of any exudate or  lesions.Normal dentition.  Neck: normal, supple, no masses, no thyromegaly Respiratory: Dry cough noted Cardiovascular: Regular rate and rhythm, no murmurs / rubs / gallops. No extremity edema. 2+ pedal pulses. No carotid bruits.  Abdomen: no tenderness, no masses palpated. No hepatosplenomegaly. Bowel sounds positive.  Musculoskeletal: no clubbing / cyanosis. No joint deformity upper and lower extremities. Good ROM, no contractures. Normal muscle tone.  Skin: no rashes, lesions, ulcers. No induration Neurologic: 4/5 strength throughout, confusion, difficulty with ASL interpreter on screen.  Mild L facial droop resolved at this point. Psychiatric: Not oriented, not really responding to ASL interpreter on screen, though it sounds like this actually may be baseline per my discussion with daughter.   Labs on Admission: I have personally reviewed following labs and imaging studies  CBC: Recent Labs  Lab 04/23/21 1649 04/23/21 1659  WBC 8.1  --   NEUTROABS 6.1  --   HGB 16.3 16.3  HCT 49.8 48.0  MCV 94.5  --   PLT 309  --    Basic Metabolic Panel: Recent Labs  Lab 04/23/21 1649 04/23/21 1659  NA 138 140  K 4.7 4.3  CL 105 104  CO2 26  --   GLUCOSE 132* 128*  BUN 22 26*  CREATININE 1.32* 1.30*  CALCIUM 8.8*  --    GFR: Estimated Creatinine Clearance: 49.5 mL/min (A) (by C-G formula based on SCr of 1.3 mg/dL (H)). Liver Function Tests: Recent Labs  Lab 04/23/21 1649  AST 34  ALT 18  ALKPHOS 51  BILITOT 1.3*  PROT 6.1*  ALBUMIN 3.6   No results for input(s): LIPASE, AMYLASE in the last 168 hours. No results for input(s): AMMONIA in the last 168 hours. Coagulation Profile: Recent Labs  Lab 04/23/21 1649  INR 1.2   Cardiac Enzymes: No results for input(s): CKTOTAL, CKMB, CKMBINDEX, TROPONINI in the last 168 hours. BNP (last 3 results) No results for input(s): PROBNP in the last 8760 hours. HbA1C: No results for input(s): HGBA1C in the last 72  hours. CBG: Recent Labs  Lab 04/23/21 1649  GLUCAP 131*   Lipid Profile: No results for input(s): CHOL, HDL, LDLCALC, TRIG, CHOLHDL, LDLDIRECT in the last 72 hours. Thyroid Function Tests: No results for input(s): TSH, T4TOTAL, FREET4, T3FREE, THYROIDAB in the last 72 hours. Anemia Panel: No results for input(s): VITAMINB12, FOLATE, FERRITIN, TIBC, IRON, RETICCTPCT in the last 72 hours. Urine analysis:    Component Value Date/Time   COLORURINE YELLOW 02/12/2021 1935   APPEARANCEUR CLEAR 02/12/2021 1935   LABSPEC 1.015 02/12/2021 1935   PHURINE 7.0 02/12/2021 1935   GLUCOSEU NEGATIVE 02/12/2021 1935   HGBUR NEGATIVE 02/12/2021  1935   BILIRUBINUR NEGATIVE 02/12/2021 1935   Lavenia Atlas NEGATIVE 02/12/2021 1935   PROTEINUR NEGATIVE 02/12/2021 1935   NITRITE NEGATIVE 02/12/2021 1935   LEUKOCYTESUR NEGATIVE 02/12/2021 1935    Radiological Exams on Admission: MR ANGIO HEAD WO CONTRAST  Result Date: 04/23/2021 CLINICAL DATA:  Stroke, follow up. Left-sided weakness and facial droop. EXAM: MRI HEAD WITHOUT CONTRAST MRA HEAD WITHOUT CONTRAST TECHNIQUE: Multiplanar, multi-echo pulse sequences of the brain and surrounding structures were acquired without intravenous contrast. Angiographic images of the Circle of Willis were acquired using MRA technique without intravenous contrast. COMPARISON:  Head CT and CTA 04/23/2021.  Head MRI 10/20/2017. FINDINGS: MRI HEAD FINDINGS The study is intermittently up to moderately motion degraded. Brain: There is no evidence of an acute infarct, intracranial hemorrhage, mass, midline shift, or extra-axial fluid collection. Small chronic infarcts are again noted in the right frontal lobe and right cerebellar hemisphere. T2 hyperintensities elsewhere in the cerebral white matter bilaterally have mildly progressed from the prior MRI and are nonspecific but compatible with extensive chronic small vessel ischemic disease. There is moderate cerebral atrophy. Vascular:  Major intracranial vascular flow voids are preserved. Skull and upper cervical spine: Unremarkable bone marrow signal. Sinuses/Orbits: Bilateral cataract extraction. Mild mucosal thickening in the paranasal sinuses. Small left mastoid effusion. Other: None. MRA HEAD FINDINGS The study is moderately motion degraded despite repeat imaging. Anterior circulation: Internal carotid arteries are patent from skull base to carotid termini with motion artifact limiting assessment for stenosis though with only mild right-sided cavernous stenosis demonstrated on today's CTA. The right A1 segment is absent. The ACAs and MCAs are patent without a gross proximal branch occlusion or high-grade proximal stenosis although assessment is limited by motion artifact. 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 Posterior circulation: Only the distal most V4 segments were included and are patent with the left being dominant. The basilar artery is widely patent. There is a patent left posterior communicating artery. Both PCAs are patent without evidence of a significant proximal stenosis. Short-segment signal loss involving the proximal left P2 segment on the second acquisition is artifactual. No aneurysm is identified. Anatomic variants: Absent right A1. IMPRESSION: 1. No acute intracranial abnormality. 2. Extensive chronic small vessel ischemic disease. 3. Motion degraded head MRA without large vessel occlusion. Electronically Signed   By: Sebastian Ache M.D.   On: 04/23/2021 21:02   MR BRAIN WO CONTRAST  Result Date: 04/23/2021 CLINICAL DATA:  Stroke, follow up. Left-sided weakness and facial droop. EXAM: MRI HEAD WITHOUT CONTRAST MRA HEAD WITHOUT CONTRAST TECHNIQUE: Multiplanar, multi-echo pulse sequences of the brain and surrounding structures were acquired without intravenous contrast. Angiographic images of the Circle of Willis were acquired using MRA technique without intravenous contrast.  COMPARISON:  Head CT and CTA 04/23/2021.  Head MRI 10/20/2017. FINDINGS: MRI HEAD FINDINGS The study is intermittently up to moderately motion degraded. Brain: There is no evidence of an acute infarct, intracranial hemorrhage, mass, midline shift, or extra-axial fluid collection. Small chronic infarcts are again noted in the right frontal lobe and right cerebellar hemisphere. T2 hyperintensities elsewhere in the cerebral white matter bilaterally have mildly progressed from the prior MRI and are nonspecific but compatible with extensive chronic small vessel ischemic disease. There is moderate cerebral atrophy. Vascular: Major intracranial vascular flow voids are preserved. Skull and upper cervical spine: Unremarkable bone marrow signal. Sinuses/Orbits: Bilateral cataract extraction. Mild mucosal thickening in the paranasal sinuses. Small left mastoid effusion. Other: None. MRA HEAD FINDINGS  The study is moderately motion degraded despite repeat imaging. Anterior circulation: Internal carotid arteries are patent from skull base to carotid termini with motion artifact limiting assessment for stenosis though with only mild right-sided cavernous stenosis demonstrated on today's CTA. The right A1 segment is absent. The ACAs and MCAs are patent without a gross proximal branch occlusion or high-grade proximal stenosis although assessment is limited by motion artifact. 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 Posterior circulation: Only the distal most V4 segments were included and are patent with the left being dominant. The basilar artery is widely patent. There is a patent left posterior communicating artery. Both PCAs are patent without evidence of a significant proximal stenosis. Short-segment signal loss involving the proximal left P2 segment on the second acquisition is artifactual. No aneurysm is identified. Anatomic variants: Absent right A1. IMPRESSION: 1. No acute intracranial  abnormality. 2. Extensive chronic small vessel ischemic disease. 3. Motion degraded head MRA without large vessel occlusion. Electronically Signed   By: Sebastian AcheAllen  Grady M.D.   On: 04/23/2021 21:02   DG CHEST PORT 1 VIEW  Result Date: 04/23/2021 CLINICAL DATA:  COVID. EXAM: PORTABLE CHEST 1 VIEW COMPARISON:  Chest x-ray 02/12/2021. FINDINGS: The aorta is tortuous with atherosclerotic calcifications. The heart is enlarged, unchanged. There are minimal strandy opacities in the left lung base. There is no pleural effusion or pneumothorax identified. No acute fractures are seen. IMPRESSION: 1. Minimal left basilar atelectasis. 2. Stable cardiomegaly. 3.  Aortic Atherosclerosis (ICD10-I70.0). Electronically Signed   By: Darliss CheneyAmy  Guttmann M.D.   On: 04/23/2021 21:30   CT HEAD CODE STROKE WO CONTRAST  Result Date: 04/23/2021 CLINICAL DATA:  Code stroke. Neuro deficit, acute, stroke suspected. Left facial droop and left arm tingling. EXAM: CT HEAD WITHOUT CONTRAST TECHNIQUE: Contiguous axial images were obtained from the base of the skull through the vertex without intravenous contrast. COMPARISON:  07/10/2020 FINDINGS: Brain: There is no evidence of an acute infarct, intracranial hemorrhage, mass, midline shift, or extra-axial fluid collection. There is mild-to-moderate cerebral atrophy. Patchy and confluent hypodensities in the cerebral white matter bilaterally are unchanged and nonspecific but compatible with extensive chronic small vessel ischemic disease. Small chronic infarcts are again noted in the right frontal operculum and cerebellum. Vascular: Calcified atherosclerosis at the skull base. No hyperdense vessel. Skull: No fracture or suspicious osseous lesion. Sinuses/Orbits: Mild mucosal thickening in the paranasal sinuses. Clear mastoid air cells. Bilateral cataract extraction. Other: None. ASPECTS [facility]([place] Stroke Program Early CT Score) - Ganglionic level infarction (caudate, lentiform nuclei, internal capsule,  insula, M1-M3 cortex): 7 - Supraganglionic infarction (M4-M6 cortex): 3 Total score (0-10 with 10 being normal): 10 IMPRESSION: 1. No evidence of acute intracranial abnormality.  ASPECTS of 10. 2. Extensive chronic small vessel ischemic disease. 3. 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 These results were communicated to Dr. Otelia LimesLindzen at 5:16 pm on 04/23/2021 by text page via the [facility] messaging system. Electronically Signed   By: Sebastian AcheAllen  Grady M.D.   On: 04/23/2021 17:16   CT ANGIO HEAD NECK W WO CM W PERF (CODE STROKE)  Result Date: 04/23/2021 CLINICAL DATA:  Neuro deficit, acute, stroke suspected. Left facial droop and left arm tingling. EXAM: CT ANGIOGRAPHY HEAD AND NECK CT PERFUSION BRAIN TECHNIQUE: Multidetector CT imaging of the head and neck was performed using the standard protocol during bolus administration of intravenous contrast. Multiplanar CT image reconstructions and MIPs were obtained to evaluate the vascular anatomy. Carotid stenosis measurements (when applicable)  are obtained utilizing NASCET criteria, using the distal internal carotid diameter as the denominator. Multiphase CT imaging of the brain was performed following IV bolus contrast injection. Subsequent parametric perfusion maps were calculated using RAPID software. CONTRAST:  OMNIPAQUE IOHEXOL 350 MG/ML SOLN COMPARISON:  None. FINDINGS: CTA NECK FINDINGS Aortic arch: Standard 3 vessel aortic arch with a moderate amount of calcified plaque. Proximally 50% stenosis of the origin of the brachiocephalic artery. Right carotid system: Patent with heavily calcified plaque at the carotid bifurcation resulting in high-grade stenosis of the origin of the ICA with quantification limited as the densely calcified plaque obscures the small residual patent lumen. Left carotid system: Patent with a moderate amount of calcified plaque at the carotid bifurcation. No evidence of a significant stenosis or dissection. Vertebral arteries: The  left vertebral artery is patent and strongly dominant without evidence of a significant stenosis or dissection. The right vertebral artery is patent and small with assessment of the V1 segment limited by venous contrast. There is calcified plaque at the origin of the right vertebral artery with assessment of a suspected associated stenosis limited due to poor visualization of the lumen due to the small size of the artery and density of the calcified plaque. Skeleton: Focally advanced disc degeneration at C5-6. Mild cervical facet arthrosis. Other neck: No evidence of cervical lymphadenopathy or mass. Upper chest: No apical lung consolidation or mass. Review of the MIP images confirms the above findings CTA HEAD FINDINGS Anterior circulation: The internal carotid arteries are patent from skull base to carotid termini with calcified plaque resulting in mild cavernous stenosis on the right. ACAs and MCAs are patent without evidence of a proximal branch occlusion or significant proximal stenosis. There is a 2 mm aneurysm projecting laterally from the left MCA bifurcation. Posterior circulation: The intracranial vertebral arteries are patent to the basilar with calcified plaque resulting in moderate left V4 stenosis. The right V4 segment is hypoplastic and diffusely irregular. The basilar artery is widely patent. There is likely a small left posterior communicating artery. Both PCAs are patent without evidence of a significant proximal stenosis. No aneurysm is identified. Venous sinuses: As permitted by contrast timing, patent. Anatomic variants: Absent right A1 segment. Review of the MIP images confirms the above findings CT Brain Perfusion Findings: The study is motion degraded. ASPECTS: 10 CBF (<30%) Volume: 0 mL Perfusion (Tmax>6.0s) volume: 14 mL Mismatch Volume: 14 mL Infarction Location: No core infarct is identified by rapid processing. The small areas of prolonged Tmax are located in the right aspect of the  posterior fossa, right occipital region, and high right frontal region and may be artifactual given involvement of multiple vascular territories and motion. IMPRESSION: 1. No emergent large vessel occlusion. 2. 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 3. Mild intracranial atherosclerosis including mild right cavernous ICA and left V4 stenoses. 4. 2 mm left MCA bifurcation aneurysm. 5. Motion degraded CTP without evidence of an acute core infarct. Small foci of possible penumbra may be artifactual. 6.  Aortic Atherosclerosis (ICD10-I70.0). Emergent results were communicated to Dr. Otelia Limes at 5:39 pm on 04/23/2021 by text page via the [facility] messaging system. Electronically Signed   By: Sebastian Ache M.D.   On: 04/23/2021 17:54    EKG: Independently reviewed.  Assessment/Plan Principal Problem:   TIA (transient ischemic attack) Active Problems:   COVID-19 virus infection   HTN (hypertension)   HLD (hyperlipidemia)  DM2 (diabetes mellitus, type 2) (HCC)   Acute metabolic encephalopathy    TIA - Stroke pathway and work up MRI neg for acute stroke Question at this point: symptomatic R ICA stenosis causing TIA, also question: indication for CEA? Neuro consult Getting carotid dopplers as they couldn't get an exact degree of R ICA stenosis on the CTA due to calcified plaque. PT/OT SLP eval in AM to make sure he is okay to eat, RN not able to do swallow study at bedside NPO for the moment IVF: LR at 100 to prevent dehydration Acute metabolic encephalopathy on top of dementia - Sounds like dementia much more significant at baseline than previously documented MCI at this point per discussion with daughter. Not clear how much of todays AMS is acute vs just his baseline dementia. Suspect any acute component is likely delirium secondary to COVID-19 COVID-19 Mild at this point with no O2 requirement, neg CXR Due to ? Of  acute metabolic encephalopathy, very advanced age of pt, will go ahead and treat with remdesivir No indication for steroids DM2 - Hold home meds Sensitive SSI Q4H for the moment while NPO HTN - Hold home meds Add short acting PRN if needed, BP goals discussion pending per neurology consult HLD - Hold home meds due to NPO status  DVT prophylaxis: Lovenox Code Status: Full code per daughter Family Communication: Spoke with daughter on phone Disposition Plan: ALF vs SNF after admit Consults called: Dr. Otelia Limes Admission status: Place in obs     Amareon Phung M. DO [facility]  How to contact the [facility] Attending or Consulting provider 7A - 7P or covering provider during after hours 7P -7A, for this patient?  Check the care team in [facility] and look for a) attending/consulting [facility] provider listed and b) the [facility] team listed Log into [email or web address]  Am[employer] Physician Scheduling and messaging for groups and whole hospitals  On call and physician scheduling software for group practices, residents, hospitalists and other medical providers for call, clinic, rotation and shift schedules. On[employer] is a hospital-wide system for scheduling doctors and paging doctors on call. EasyPlot is for scientific plotting and data analysis.  [email or web address]  and use [employer]'s universal password to access. If you do not have the password, please contact the hospital operator.  Locate the [facility] provider you are looking for under [facility] and page to a number that you can be directly reached. If you still have difficulty reaching the provider, please page the [facility] (Director on Call) for the Hospitalists listed on amion for assistance.  04/23/2021, 10:25 PM

## 2021-04-23 NOTE — ED Provider Notes (Signed)
[facility] EMERGENCY DEPARTMENT Provider Note   CSN: [id number] Arrival date & time: 04/23/21  1646  An emergency department physician performed an initial assessment on this suspected stroke patient at 1652.  History Chief Complaint  Patient presents with   Code Stroke   LEVEL 5 CAVEAT - CODE STROKE/PT IS DEAF AND NONVERBAL  Bobby Rojas is a 87 y.o. male who presents to the ED today via EMS for code stroke. LKN 12:30 PM today. Per EMS they were called out for left sided facial droop and left sided weakness. History significantly limited as pt is deaf/nonverbal at baseline. He is currently communicating via ASL interpretor. Per Interpretor he seems to be using his left hand less than his right.   The history is provided by the patient, the EMS personnel and medical records. History limited by: Pt deaf/nonverbal. A language interpreter was used (ASL).      Past Medical History:  Diagnosis Date   Atherosclerosis    CAD (coronary artery disease)    Deaf    bilateral   Depression    Diabetes mellitus without complication (HCC)    Gastroenteritis    Hypercholesterolemia    Hypertension    Hypothyroid    MCI (mild cognitive impairment)    OSA (obstructive sleep apnea)     Patient Active Problem List   Diagnosis Date Noted   Acute ischemic stroke (HCC) 04/23/2021     Past Surgical History:  Procedure Laterality Date   BLADDER SURGERY     CATARACT EXTRACTION, BILATERAL     ELBOW FRACTURE SURGERY     KNEE SURGERY Right        No family history on file.  Social History   Tobacco Use   Smoking status: Former    Types: Cigars   Smokeless tobacco: Never  Substance Use Topics   Alcohol use: Not Currently   Drug use: Never    Home Medications Prior to Admission medications   Medication Sig Start Date End Date Taking? Authorizing Provider  atorvastatin (LIPITOR) 40 MG tablet TAKE 1 TABLET BY MOUTH  DAILY 08/23/18   [provider]   Cholecalciferol (VITAMIN D3) 25 MCG (1000 UT) CAPS Take by mouth.    [provider]  citalopram (CELEXA) 40 MG tablet TAKE 1 TABLET BY MOUTH  EVERY MORNING 11/12/18   [provider]  famotidine (PEPCID) 20 MG tablet Take by mouth. 10/19/18   [provider]  levothyroxine (SYNTHROID) 75 MCG tablet TAKE 1 TABLET BY MOUTH  DAILY AT 6AM 08/01/15   [provider]  lisinopril (ZESTRIL) 20 MG tablet TK 1 T PO QD 10/14/18   [provider]  memantine (NAMENDA) 5 MG tablet Take by mouth. 11/12/18   [provider]  metFORMIN (GLUCOPHAGE-XR) 500 MG 24 hr tablet Take 1,000 mg by mouth 2 (two) times daily. 10/14/18   [provider]  Multiple Vitamin (MULTIVITAMIN) tablet Take by mouth.    [provider]  rOPINIRole (REQUIP) 0.5 MG tablet  10/29/18   [provider]    Allergies    Patient has no known allergies.  Review of Systems   Review of Systems  Unable to perform ROS: Patient nonverbal   Physical Exam Updated Vital Signs BP (!) 156/80   Pulse 73   Temp 99.2 F (37.3 C) (Oral)   Resp 14   Ht 6' (1.829 m)   Wt 94 kg   SpO2 99%   BMI 28.11 kg/m  Physical Exam Vitals and nursing note reviewed.  Constitutional:      Appearance: He is not ill-appearing.  HENT:     Head: Normocephalic and atraumatic.  Eyes:     Extraocular Movements: Extraocular movements intact.     Conjunctiva/sclera: Conjunctivae normal.     Pupils: Pupils are equal, round, and reactive to light.  Cardiovascular:     Rate and Rhythm: Normal rate and regular rhythm.  Pulmonary:     Effort: Pulmonary effort is normal.     Breath sounds: Normal breath sounds.  Abdominal:     Palpations: Abdomen is soft.     Tenderness: There is no abdominal tenderness.  Musculoskeletal:     Cervical back: Neck supple.  Skin:    General: Skin is warm and dry.  Neurological:     Mental Status: He is alert.     Comments: Alert.   Unable to  assess speech due to patient being nonverbal at baseline.   Strength 5/5 in upper/lower extremities   Sensation appears slightly diminished to LUE  No pronator drift.  Normal finger-to-nose. CN I not tested  CN II grossly intact visual fields bilaterally. Did not visualize posterior eye.  CN III, IV, VI PERRLA and EOMs intact bilaterally  CN V Intact sensation to sharp and light touch to the face  CN VII facial movements symmetric  CN VIII not tested  CN IX, X no uvula deviation, symmetric rise of soft palate  CN XI 5/5 SCM and trapezius strength bilaterally  CN XII Midline tongue protrusion, symmetric L/R movements      ED Results / Procedures / Treatments   Labs (all labs ordered are listed, but only abnormal results are displayed) Labs Reviewed  DIFFERENTIAL - Abnormal; Notable for the following components:      Result Value   Monocytes Absolute 1.1 (*)    All other components within normal limits  COMPREHENSIVE METABOLIC PANEL - Abnormal; Notable for the following components:   Glucose, Bld 132 (*)    Creatinine, Ser 1.32 (*)    Calcium 8.8 (*)    Total Protein 6.1 (*)    Total Bilirubin 1.3 (*)    GFR, Estimated 53 (*)    All other components within normal limits  I-STAT CHEM 8, ED - Abnormal; Notable for the following components:   BUN 26 (*)    Creatinine, Ser 1.30 (*)    Glucose, Bld 128 (*)    Calcium, Ion 1.06 (*)    All other components within normal limits  CBG MONITORING, ED - Abnormal; Notable for the following components:   Glucose-Capillary 131 (*)    All other components within normal limits  RESP PANEL BY RT-PCR (FLU A&B, COVID) ARPGX2  PROTIME-INR  APTT  CBC  ETHANOL  RAPID URINE DRUG SCREEN, HOSP PERFORMED  URINALYSIS, ROUTINE W REFLEX MICROSCOPIC  HEMOGLOBIN A1C  LIPID PANEL    EKG None  Radiology CT HEAD CODE STROKE WO CONTRAST  Result Date: 04/23/2021 CLINICAL DATA:  Code stroke. Neuro deficit, acute, stroke suspected. Left facial  droop and left arm tingling. EXAM: CT HEAD WITHOUT CONTRAST TECHNIQUE: Contiguous axial images were obtained from the base of the skull through the vertex without intravenous contrast. COMPARISON:  07/10/2020 FINDINGS: Brain: There is no evidence of an acute infarct, intracranial hemorrhage, mass, midline shift, or extra-axial fluid collection. There is mild-to-moderate cerebral atrophy. Patchy and confluent hypodensities in the cerebral white matter bilaterally are unchanged and nonspecific but compatible with extensive chronic small  vessel ischemic disease. Small chronic infarcts are again noted in the right frontal operculum and cerebellum. Vascular: Calcified atherosclerosis at the skull base. No hyperdense vessel. Skull: No fracture or suspicious osseous lesion. Sinuses/Orbits: Mild mucosal thickening in the paranasal sinuses. Clear mastoid air cells. Bilateral cataract extraction. Other: None. ASPECTS [facility] Stroke Program Early CT Score) - Ganglionic level infarction (caudate, lentiform nuclei, internal capsule, insula, M1-M3 cortex): 7 - Supraganglionic infarction (M4-M6 cortex): 3 Total score (0-10 with 10 being normal): 10 IMPRESSION: 1. No evidence of acute intracranial abnormality.  ASPECTS of 10. 2. Extensive chronic small vessel ischemic disease. 3. 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 These results were communicated to Dr. Otelia Limes at 5:16 pm on 04/23/2021 by text page via the [facility] messaging system. Electronically Signed   By: Sebastian Ache M.D.   On: 04/23/2021 17:16   CT ANGIO HEAD NECK W WO CM W PERF (CODE STROKE)  Result Date: 04/23/2021 CLINICAL DATA:  Neuro deficit, acute, stroke suspected. Left facial droop and left arm tingling. EXAM: CT ANGIOGRAPHY HEAD AND NECK CT PERFUSION BRAIN TECHNIQUE: Multidetector CT imaging of the head and neck was performed using the standard protocol during bolus administration of intravenous contrast. Multiplanar CT image reconstructions and MIPs  were obtained to evaluate the vascular anatomy. Carotid stenosis measurements (when applicable) are obtained utilizing NASCET criteria, using the distal internal carotid diameter as the denominator. Multiphase CT imaging of the brain was performed following IV bolus contrast injection. Subsequent parametric perfusion maps were calculated using RAPID software. CONTRAST:  OMNIPAQUE IOHEXOL 350 MG/ML SOLN COMPARISON:  None. FINDINGS: CTA NECK FINDINGS Aortic arch: Standard 3 vessel aortic arch with a moderate amount of calcified plaque. Proximally 50% stenosis of the origin of the brachiocephalic artery. Right carotid system: Patent with heavily calcified plaque at the carotid bifurcation resulting in high-grade stenosis of the origin of the ICA with quantification limited as the densely calcified plaque obscures the small residual patent lumen. Left carotid system: Patent with a moderate amount of calcified plaque at the carotid bifurcation. No evidence of a significant stenosis or dissection. Vertebral arteries: The left vertebral artery is patent and strongly dominant without evidence of a significant stenosis or dissection. The right vertebral artery is patent and small with assessment of the V1 segment limited by venous contrast. There is calcified plaque at the origin of the right vertebral artery with assessment of a suspected associated stenosis limited due to poor visualization of the lumen due to the small size of the artery and density of the calcified plaque. Skeleton: Focally advanced disc degeneration at C5-6. Mild cervical facet arthrosis. Other neck: No evidence of cervical lymphadenopathy or mass. Upper chest: No apical lung consolidation or mass. Review of the MIP images confirms the above findings CTA HEAD FINDINGS Anterior circulation: The internal carotid arteries are patent from skull base to carotid termini with calcified plaque resulting in mild cavernous stenosis on the right. ACAs and  MCAs are patent without evidence of a proximal branch occlusion or significant proximal stenosis. There is a 2 mm aneurysm projecting laterally from the left MCA bifurcation. Posterior circulation: The intracranial vertebral arteries are patent to the basilar with calcified plaque resulting in moderate left V4 stenosis. The right V4 segment is hypoplastic and diffusely irregular. The basilar artery is widely patent. There is likely a small left posterior communicating artery. Both PCAs are patent without evidence of a significant proximal stenosis. No aneurysm is identified. Venous sinuses: As permitted by contrast timing, patent.  Anatomic variants: Absent right A1 segment. Review of the MIP images confirms the above findings CT Brain Perfusion Findings: The study is motion degraded. ASPECTS: 10 CBF (<30%) Volume: 0 mL Perfusion (Tmax>6.0s) volume: 14 mL Mismatch Volume: 14 mL Infarction Location: No core infarct is identified by rapid processing. The small areas of prolonged Tmax are located in the right aspect of the posterior fossa, right occipital region, and high right frontal region and may be artifactual given involvement of multiple vascular territories and motion. IMPRESSION: 1. No emergent large vessel occlusion. 2. 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 3. Mild intracranial atherosclerosis including mild right cavernous ICA and left V4 stenoses. 4. 2 mm left MCA bifurcation aneurysm. 5. Motion degraded CTP without evidence of an acute core infarct. Small foci of possible penumbra may be artifactual. 6.  Aortic Atherosclerosis (ICD10-I70.0). Emergent results were communicated to Dr. Otelia Limes at 5:39 pm on 04/23/2021 by text page via the [facility] messaging system. Electronically Signed   By: Sebastian Ache M.D.   On: 04/23/2021 17:54    Procedures Procedures   Medications Ordered in ED Medications   stroke: mapping our  early stages of recovery book (has no administration in time range)  acetaminophen (TYLENOL) tablet 650 mg (has no administration in time range)    Or  acetaminophen (TYLENOL) 160 MG/5ML solution 650 mg (has no administration in time range)    Or  acetaminophen (TYLENOL) suppository 650 mg (has no administration in time range)  enoxaparin (LOVENOX) injection 40 mg (has no administration in time range)  iohexol (OMNIPAQUE) 350 MG/ML injection 100 mL (100 mLs Intravenous Contrast Given 04/23/21 1725)    ED Course  I have reviewed the triage vital signs and the nursing notes.  Pertinent labs & imaging results that were available during my care of the patient were reviewed by me and considered in my medical decision making (see chart for details).    MDM Rules/Calculators/A&P                           87 year old male who presents to the ED today as a code stroke with last known normal around 12:30 PM today.  He has a history of deafness, communicates with American sign language.  He is also nonverbal at baseline.  On arrival patient is evaluated at the bridge with neurologist Dr. Otelia Limes.  History is limited secondary to having to use American sign language interpreter.  It does appear that he has some diminished sensation to his left upper extremity however the remainder of his neuro exam does appear intact. Does not appear to be VAN positive at this time. CT head obtained without any signs of bleed.  We will proceed with CTA and perfusion study.  Per Dr. Otelia Limes if this is negative patient may require MRI for further evaluation.  CTA: IMPRESSION:  1. No emergent large vessel occlusion.  2. High-grade stenosis of the right ICA at its origin with  quantification limited by the heavily calcified nature of the  plaque.  3. Mild intracranial atherosclerosis including mild right cavernous  ICA and left V4 stenoses.  4. 2 mm left MCA bifurcation aneurysm.  5. Motion degraded CTP without evidence  of an acute core infarct.  Small foci of possible penumbra may be artifactual.  6.  Aortic Atherosclerosis (ICD10-I70.0).   Will admit for stroke workup at this time. MRI/MRA  ordered. Remainder of labs unremarkable.   Discussed case with [facility] Dr. Julian Reil who agrees to accept patient for admission.   This note was prepared using Dragon voice recognition software and may include unintentional dictation errors due to the inherent limitations of voice recognition software.   Final Clinical Impression(s) / ED Diagnoses Final diagnoses:  Left-sided weakness    Rx / DC Orders ED Discharge Orders     None        Tanda Rockers, PA-C 04/23/21 1940    Melene Plan, DO 04/23/21 2117

## 2021-04-23 NOTE — Consult Note (Signed)
Referring Physician: Dr. Adela Lank    Chief Complaint: Acute onset of left facial droop and left sided weakness  HPI: Bobby Rojas is an 87 y.o. male with deafness (communicates with sign language), mute, CAD, DM, hypercholesterolemia, HTN, MCI, OSA and hypothyroidism who presents as a Code Stroke via EMS for evaluation of acute left sided weakness. LKN was 1230 while at home. He was noted by family to be with new left sided weakness and left facial droop. He was apparently complaining of left arm tingling by signing. On EMS arrival he was noted to have left sided weakness. He was slumped to the left in his stretcher on arrival to the ED. He was taken to CT emergently.   LSN: 1230 IV thrombolytic given: No: Out of time window.  Past Medical History:  Diagnosis Date   Atherosclerosis    CAD (coronary artery disease)    Deaf    bilateral   Depression    Diabetes mellitus without complication (HCC)    Gastroenteritis    Hypercholesterolemia    Hypertension    Hypothyroid    MCI (mild cognitive impairment)    OSA (obstructive sleep apnea)     Past Surgical History:  Procedure Laterality Date   BLADDER SURGERY     CATARACT EXTRACTION, BILATERAL     ELBOW FRACTURE SURGERY     KNEE SURGERY Right     No family history on file. Social History:  reports that he has quit smoking. His smoking use included cigars. He has never used smokeless tobacco. He reports that he does not currently use alcohol. He reports that he does not use drugs.  Allergies: No Known Allergies  Medications:  No current facility-administered medications on file prior to encounter.   Current Outpatient Medications on File Prior to Encounter  Medication Sig Dispense Refill   atorvastatin (LIPITOR) 40 MG tablet TAKE 1 TABLET BY MOUTH  DAILY     Cholecalciferol (VITAMIN D3) 25 MCG (1000 UT) CAPS Take by mouth.     citalopram (CELEXA) 40 MG tablet TAKE 1 TABLET BY MOUTH  EVERY MORNING     famotidine (PEPCID) 20 MG  tablet Take by mouth.     levothyroxine (SYNTHROID) 75 MCG tablet TAKE 1 TABLET BY MOUTH  DAILY AT 6AM     lisinopril (ZESTRIL) 20 MG tablet TK 1 T PO QD     memantine (NAMENDA) 5 MG tablet Take by mouth.     metFORMIN (GLUCOPHAGE-XR) 500 MG 24 hr tablet Take 1,000 mg by mouth 2 (two) times daily.     Multiple Vitamin (MULTIVITAMIN) tablet Take by mouth.     rOPINIRole (REQUIP) 0.5 MG tablet        ROS: Unable to obtain due to communication deficit  Physical Examination: There were no vitals taken for this visit.  HEENT: [place]/AT Lungs: Respirations unlabored Ext: Warm and well perfused  Neurologic Examination: Mental Status: Awake but with mildly decreased level of alertness. Mute at baseline. With sign language interpreter, patient signs more effectively with his right hand than his left. His signed communication is fragmentary with difficulty expressing ideas, but can answer with single signed word replies or 2-3 word phrases. Comprehension deficit; easily becomes confused with signed questions.  Cranial Nerves: II:  Temporal visual fields with intact blink to threat bilaterally. PERRL. Fixates normally.  III,IV, VI: No ptosis. EOMI.  V,VII: Mild left facial droop resolves while in CT. Temp sensation equal bilaterally  VIII: Bilaterally deaf IX,X: No hypophonia XI: Head is  midline XII: Midline tongue extension  Motor: Right : Upper extremity   4+/5    Left:     Upper extremity   4+/5  Lower extremity   4/5     Lower extremity   4/5 No upper extremity drift.  Difficulty following commands for antigravity/drift testing of BLE, but with nearly full strength when withdrawing to noxious plantar stimulation bilaterally  Sensory: Intact to pinch bilateral upper extremities, but more brisk responses on the right.  Withdraws BLE to noxious but with mild delay on the left relative to the right.  Deep Tendon Reflexes:  1+ bilateral brachioradialis and patellae Plantars: Right:  downgoing   Left: downgoing Cerebellar: No ataxia with FNF bilaterally, but slow. Was not able to follow commands for H-S testing.  Gait: Deferred due to falls risk concerns   No results found for this or any previous visit (from the past 48 hour(s)). No results found.  Assessment: 87 y.o. male with deafness (communicates with sign language), mute, CAD, DM, hypercholesterolemia, HTN, MCI, OSA and hypothyroidism who presents as a Code Stroke via EMS for evaluation of acute left sided weakness. LKN was 1230 while at home. He was noted by family to be with new left sided weakness and left facial droop. He was apparently complaining of left arm tingling by signing. On EMS arrival he was noted to have left sided weakness. He was slumped to the left in his stretcher on arrival to the ED. He was taken to CT emergently.  1. Exam findings suggestive of right hemisphere stroke, but with significant improvement during examination in CT.  2. CT head negative for acute abnormality. Extensive chronic small vessel ischemic disease. Small chronic right frontal and cerebellar infarcts 3. CTA of head and neck: No emergent large vessel occlusion. 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 Mild intracranial atherosclerosis including mild right cavernous ICA and left V4 stenoses. 2 mm left MCA bifurcation aneurysm.  4. Motion degraded CTP without evidence of an acute core infarct. Small foci of possible penumbra may be artifactual. 5. Stroke Risk Factors - CAD, DM, hypercholesterolemia, HTN and OSA  Recommendations: 1. HgbA1c, fasting lipid panel 2. MRI of the brain without contrast 3. PT consult, OT consult, Speech consult 4. Echocardiogram 5. Hospitalist has ordered carotid US to get better look at the R ICA stenosis that Radiology was unable to quantify except to say high grade on the CTA  6. Prophylactic therapy-Continue  atorvastatin. Start ASA 81 mg po qd first dose now.  7. Risk factor modification 8. Telemetry monitoring 9. Frequent neuro checks 10. Modified permissive HTN protocol given advanced age. Treat SBP if > 180   @Electronically  signed: Dr.  04/23/2021, 4:58 PM

## 2021-04-24 ENCOUNTER — Observation Stay (HOSPITAL_COMMUNITY): Payer: Medicare Other

## 2021-04-24 DIAGNOSIS — E7800 Pure hypercholesterolemia, unspecified: Secondary | ICD-10-CM | POA: Diagnosis not present

## 2021-04-24 DIAGNOSIS — I6389 Other cerebral infarction: Secondary | ICD-10-CM

## 2021-04-24 DIAGNOSIS — I6521 Occlusion and stenosis of right carotid artery: Secondary | ICD-10-CM

## 2021-04-24 DIAGNOSIS — Z87891 Personal history of nicotine dependence: Secondary | ICD-10-CM

## 2021-04-24 DIAGNOSIS — U071 COVID-19: Secondary | ICD-10-CM | POA: Diagnosis not present

## 2021-04-24 DIAGNOSIS — G459 Transient cerebral ischemic attack, unspecified: Secondary | ICD-10-CM

## 2021-04-24 DIAGNOSIS — H913 Deaf nonspeaking, not elsewhere classified: Secondary | ICD-10-CM

## 2021-04-24 DIAGNOSIS — Z7902 Long term (current) use of antithrombotics/antiplatelets: Secondary | ICD-10-CM

## 2021-04-24 DIAGNOSIS — G9341 Metabolic encephalopathy: Secondary | ICD-10-CM

## 2021-04-24 DIAGNOSIS — Z7982 Long term (current) use of aspirin: Secondary | ICD-10-CM

## 2021-04-24 DIAGNOSIS — F0390 Unspecified dementia without behavioral disturbance: Secondary | ICD-10-CM

## 2021-04-24 DIAGNOSIS — E1159 Type 2 diabetes mellitus with other circulatory complications: Secondary | ICD-10-CM | POA: Diagnosis not present

## 2021-04-24 LAB — LIPID PANEL
Cholesterol: 122 mg/dL (ref 0–200)
HDL: 42 mg/dL (ref >40)
LDL Cholesterol: 68 mg/dL (ref 0–99)
Total CHOL/HDL Ratio: 2.9 RATIO
Triglycerides: 61 mg/dL (ref <150)
VLDL: 12 mg/dL (ref 0–40)

## 2021-04-24 LAB — ECHOCARDIOGRAM COMPLETE
AR max vel: 0.75 cm2
AV Area VTI: 0.79 cm2
AV Area mean vel: 0.7 cm2
AV Mean grad: 18 mmHg
AV Peak grad: 30.9 mmHg
Ao pk vel: 2.78 m/s
Area-P 1/2: 3.16 cm2
Height: 72 in
P 1/2 time: 519 msec
S' Lateral: 2.9 cm
Weight: 3315.72 oz

## 2021-04-24 LAB — CBG MONITORING, ED
Glucose-Capillary: 125 mg/dL — ABNORMAL HIGH (ref 70–99)
Glucose-Capillary: 94 mg/dL (ref 70–99)
Glucose-Capillary: 90 mg/dL (ref 70–99)

## 2021-04-24 LAB — RAPID URINE DRUG SCREEN, HOSP PERFORMED
Amphetamines: NOT DETECTED
Barbiturates: NOT DETECTED
Benzodiazepines: NOT DETECTED
Cocaine: NOT DETECTED
Opiates: NOT DETECTED
Tetrahydrocannabinol: NOT DETECTED

## 2021-04-24 LAB — URINALYSIS, ROUTINE W REFLEX MICROSCOPIC
Bilirubin Urine: NEGATIVE
Glucose, UA: NEGATIVE mg/dL
Hgb urine dipstick: NEGATIVE
Ketones, ur: 5 mg/dL — AB
Leukocytes,Ua: NEGATIVE
Nitrite: NEGATIVE
Protein, ur: NEGATIVE mg/dL
Specific Gravity, Urine: 1.026 (ref 1.005–1.030)
pH: 6 (ref 5.0–8.0)

## 2021-04-24 LAB — HEMOGLOBIN A1C
Hgb A1c MFr Bld: 6 % — ABNORMAL HIGH (ref 4.8–5.6)
Mean Plasma Glucose: 125.5 mg/dL

## 2021-04-24 LAB — GLUCOSE, CAPILLARY: Glucose-Capillary: 164 mg/dL — ABNORMAL HIGH (ref 70–99)

## 2021-04-24 MED ORDER — SODIUM CHLORIDE 0.9 % IV SOLN
INTRAVENOUS | Status: DC | PRN
  Administered 2021-04-24 – 2021-04-27 (×2): 250 mL via INTRAVENOUS

## 2021-04-24 MED ORDER — LEVOTHYROXINE SODIUM 75 MCG PO TABS
75.0000 ug | ORAL_TABLET | Freq: Every day | ORAL | Status: DC
  Administered 2021-04-25 – 2021-04-28 (×4): 75 ug via ORAL
  Filled 2021-04-24 (×4): qty 1

## 2021-04-24 MED ORDER — ASPIRIN EC 325 MG PO TBEC: 325.0000 mg | DELAYED_RELEASE_TABLET | Freq: Every day | ORAL | Status: DC

## 2021-04-24 MED ORDER — PERFLUTREN LIPID MICROSPHERE
1.0000 mL | INTRAVENOUS | Status: AC | PRN
  Administered 2021-04-24: 2 mL via INTRAVENOUS
  Filled 2021-04-24: qty 10

## 2021-04-24 MED ORDER — ASPIRIN 300 MG RE SUPP
300.0000 mg | Freq: Every day | RECTAL | Status: DC
  Administered 2021-04-24 – 2021-04-25 (×2): 300 mg via RECTAL
  Filled 2021-04-24 (×2): qty 1

## 2021-04-24 MED ORDER — ATORVASTATIN CALCIUM 40 MG PO TABS
40.0000 mg | ORAL_TABLET | Freq: Every day | ORAL | Status: DC
  Administered 2021-04-25 – 2021-04-27 (×3): 40 mg via ORAL
  Filled 2021-04-24 (×3): qty 1

## 2021-04-24 MED ORDER — CLOPIDOGREL BISULFATE 75 MG PO TABS: 300.0000 mg | ORAL_TABLET | Freq: Once | ORAL | Status: DC

## 2021-04-24 MED ORDER — DEXAMETHASONE SODIUM PHOSPHATE 10 MG/ML IJ SOLN
6.0000 mg | INTRAMUSCULAR | Status: DC
  Administered 2021-04-24 – 2021-04-27 (×4): 6 mg via INTRAVENOUS
  Filled 2021-04-24 (×4): qty 1

## 2021-04-24 MED ORDER — DIVALPROEX SODIUM 125 MG PO CSDR
375.0000 mg | DELAYED_RELEASE_CAPSULE | Freq: Two times a day (BID) | ORAL | Status: DC
  Administered 2021-04-25 – 2021-04-28 (×7): 375 mg via ORAL
  Filled 2021-04-24 (×9): qty 3

## 2021-04-24 MED ORDER — CLOPIDOGREL BISULFATE 75 MG PO TABS
75.0000 mg | ORAL_TABLET | Freq: Every day | ORAL | Status: DC
  Administered 2021-04-25 – 2021-04-28 (×4): 75 mg via ORAL
  Filled 2021-04-24 (×4): qty 1

## 2021-04-24 NOTE — Progress Notes (Signed)
Lower extremity venous has been completed.   Preliminary results in CV Proc.   Aundra Millet Xadrian Craighead 04/24/2021 2:56 PM

## 2021-04-24 NOTE — Progress Notes (Signed)
PROGRESS NOTE    Demyan Fugate  ZOX:[id number] DOB: 1934/05/29 DOA: 04/23/2021 PCP: Jolene Provost, MD   Brief Narrative: 87 year old with past medical history significant for deafness, CAD, diabetes type 2, hypertension, hyperlipidemia, dementia.  Per HPI daughter reporter patient has advancing dementia.  Only person who can communicate with him routinely is his son-in-law who has to be present in person.  Patient used to do okay with ASL interpreter over a monitor but now does not really do well with video interpreters anymore.  Now days answer most questions even to son-in-law as yes and no.  Presented from a skilled nursing facility after he was noticed to have left arm weakness and left-sided facial droop.  Report that the patient had left arm tingling as well.  Work-up in the ED: MRI negative for acute stroke, CTA does show high-grade right ICA origin stenosis.  Received Ativan for MRI.  Patient was found to have to be positive for COVID.    Assessment & Plan:   Principal Problem:   TIA (transient ischemic attack) Active Problems:   COVID-19 virus infection   HTN (hypertension)   HLD (hyperlipidemia)   DM2 (diabetes mellitus, type 2) (HCC)   Acute metabolic encephalopathy   1-TIA: Patient noticed to have left facial weakness, left arm weakness. MRI negative for acute stroke. CTA: Showed high-grade stenosis of the right ICA at its origin with quantification limited by heavily calcified nature of the plaque. Neurology recommended vascular surgery evaluation. Patient was a started on Plavix.  2-Acute metabolic encephalopathy, on top of underlying dementia: Patient is sleepy and lethargic this morning could be related to Ativan. Component of delirium secondary to COVID  3-COVID-19: -No oxygen requirement, and negative chest x-ray. -Continue with Remdesivir -If hypoxemia, Might need to IV steroid.   High-grade stenosis of the right ICA: Vascular consulted.  DM type 2 Hold  home medications. Continue  sliding scale insulin  HTN; holding Norvasc and lisinopril  HLD; continue with the statins Hypothyroidism: Continue with Synthroid      Estimated body mass index is 28.11 kg/m as calculated from the following:   Height as of this encounter: 6' (1.829 m).   Weight as of this encounter: 94 kg.   DVT prophylaxis: Lovenox Code Status: Full code Family Communication: Daughter over phone.  Disposition Plan:  Status is: Observation  The patient remains OBS appropriate and will d/c before 2 midnights.  Dispo: The patient is from: ALF plus memory care unit.               Anticipated d/c is to: SNF              Patient currently is not medically stable to d/c.   Difficult to place patient No        Consultants:  Neurology Vascular surgery   Procedures:    Antimicrobials:    Subjective: He is lethargic, protecting airway, noticed to be coughing at times.  Receive Ativan for MRI.   Objective: Vitals:   04/23/21 2200 04/24/21 0000 04/24/21 0200 04/24/21 0505  BP: (!) 156/73 (!) 150/87 (!) 162/75 (!) 151/76  Pulse: 76 83 77 74  Resp: (!) 23 (!) 23 (!) 23 (!) 22  Temp:      TempSrc:      SpO2: 100% 95% 94% 94%  Weight:      Height:        Intake/Output Summary (Last 24 hours) at 04/24/2021 0847 Last data filed at 04/23/2021 2359 Gross per  24 hour  Intake 250 ml  Output --  Net 250 ml   Filed Weights   04/23/21 1600 04/23/21 1737  Weight: 94 kg 94 kg    Examination:  General exam: Appears calm and comfortable  Respiratory system: Clear to auscultation. Respiratory effort normal. Cardiovascular system: S1 & S2 heard, RRR.  Gastrointestinal system: Abdomen is nondistended, soft and nontender. No organomegaly or masses felt. Normal bowel sounds heard. Central nervous system: Lethargic Extremities: Symmetric 5 x 5 power.   Data Reviewed: I have personally reviewed following labs and imaging studies  CBC: Recent Labs  Lab  04/23/21 1649 04/23/21 1659  WBC 8.1  --   NEUTROABS 6.1  --   HGB 16.3 16.3  HCT 49.8 48.0  MCV 94.5  --   PLT 309  --    Basic Metabolic Panel: Recent Labs  Lab 04/23/21 1649 04/23/21 1659  NA 138 140  K 4.7 4.3  CL 105 104  CO2 26  --   GLUCOSE 132* 128*  BUN 22 26*  CREATININE 1.32* 1.30*  CALCIUM 8.8*  --    GFR: Estimated Creatinine Clearance: 49.5 mL/min (A) (by C-G formula based on SCr of 1.3 mg/dL (H)). Liver Function Tests: Recent Labs  Lab 04/23/21 1649  AST 34  ALT 18  ALKPHOS 51  BILITOT 1.3*  PROT 6.1*  ALBUMIN 3.6   No results for input(s): LIPASE, AMYLASE in the last 168 hours. No results for input(s): AMMONIA in the last 168 hours. Coagulation Profile: Recent Labs  Lab 04/23/21 1649  INR 1.2   Cardiac Enzymes: No results for input(s): CKTOTAL, CKMB, CKMBINDEX, TROPONINI in the last 168 hours. BNP (last 3 results) No results for input(s): PROBNP in the last 8760 hours. HbA1C: Recent Labs    04/24/21 0434  HGBA1C 6.0*   CBG: Recent Labs  Lab 04/23/21 1649 04/23/21 2251 04/24/21 0337  GLUCAP 131* 115* 125*   Lipid Profile: Recent Labs    04/24/21 0436  CHOL 122  HDL 42  LDLCALC 68  TRIG 61  CHOLHDL 2.9   Thyroid Function Tests: No results for input(s): TSH, T4TOTAL, FREET4, T3FREE, THYROIDAB in the last 72 hours. Anemia Panel: No results for input(s): VITAMINB12, FOLATE, FERRITIN, TIBC, IRON, RETICCTPCT in the last 72 hours. Sepsis Labs: No results for input(s): PROCALCITON, LATICACIDVEN in the last 168 hours.  Recent Results (from the past 240 hour(s))  Resp Panel by RT-PCR (Flu A&B, Covid) Nasopharyngeal Swab     Status: Abnormal   Collection Time: 04/23/21  6:31 PM   Specimen: Nasopharyngeal Swab; Nasopharyngeal(NP) swabs in vial transport medium  Result Value Ref Range Status   SARS Coronavirus 2 by RT PCR POSITIVE (A) NEGATIVE Final    Comment: RESULT CALLED TO, READ BACK BY AND VERIFIED WITH: DACIA HARRIS  04/23/2021 @2036  BY JW (NOTE) SARS-CoV-2 target nucleic acids are DETECTED.  The SARS-CoV-2 RNA is generally detectable in upper respiratory specimens during the acute phase of infection. Positive results are indicative of the presence of the identified virus, but do not rule out bacterial infection or co-infection with other pathogens not detected by the test. Clinical correlation with patient history and other diagnostic information is necessary to determine patient infection status. The expected result is Negative.  Fact Sheet for Patients:  Fact Sheet for Healthcare Providers: BloggerCourse.com  This test is not yet approved or cleared by the [place] FDA and  has been authorized for detection and/or diagnosis of SARS-CoV-2 by FDA under an  Emergency Use Authorization (EUA).  This EUA will remain in effect (meaning this test can be  used) for the duration of  the COVID-19 declaration under Section 564(b)(1) of the Act, 21 U.S.C. section 360bbb-3(b)(1), unless the authorization is terminated or revoked sooner.     Influenza A by PCR NEGATIVE NEGATIVE Final   Influenza B by PCR NEGATIVE NEGATIVE Final    Comment: (NOTE) The Xpert Xpress SARS-CoV-2/FLU/RSV plus assay is intended as an aid in the diagnosis of influenza from Nasopharyngeal swab specimens and should not be used as a sole basis for treatment. Nasal washings and aspirates are unacceptable for Xpert Xpress SARS-CoV-2/FLU/RSV testing.  Fact Sheet for Patients: BloggerCourse.comhttps://www.fda.gov/media/152166/download  Fact Sheet for Healthcare Providers: SeriousBroker.ithttps://www.fda.gov/media/152162/download  This test is not yet approved or cleared by the [place] FDA and has been authorized for detection and/or diagnosis of SARS-CoV-2 by FDA under an Emergency Use Authorization (EUA). This EUA will remain in effect (meaning this test can be used) for the duration of  the COVID-19 declaration under Section 564(b)(1) of the Act, 21 U.S.C. section 360bbb-3(b)(1), unless the authorization is terminated or revoked.  Performed at [facility], [address] [place], [place] [address]          Radiology Studies: MR ANGIO HEAD WO CONTRAST  Result Date: 04/23/2021 CLINICAL DATA:  Stroke, follow up. Left-sided weakness and facial droop. EXAM: MRI HEAD WITHOUT CONTRAST MRA HEAD WITHOUT CONTRAST TECHNIQUE: Multiplanar, multi-echo pulse sequences of the brain and surrounding structures were acquired without intravenous contrast. Angiographic images of the Circle of Willis were acquired using MRA technique without intravenous contrast. COMPARISON:  Head CT and CTA 04/23/2021.  Head MRI 10/20/2017. FINDINGS: MRI HEAD FINDINGS The study is intermittently up to moderately motion degraded. Brain: There is no evidence of an acute infarct, intracranial hemorrhage, mass, midline shift, or extra-axial fluid collection. Small chronic infarcts are again noted in the right frontal lobe and right cerebellar hemisphere. T2 hyperintensities elsewhere in the cerebral white matter bilaterally have mildly progressed from the prior MRI and are nonspecific but compatible with extensive chronic small vessel ischemic disease. There is moderate cerebral atrophy. Vascular: Major intracranial vascular flow voids are preserved. Skull and upper cervical spine: Unremarkable bone marrow signal. Sinuses/Orbits: Bilateral cataract extraction. Mild mucosal thickening in the paranasal sinuses. Small left mastoid effusion. Other: None. MRA HEAD FINDINGS The study is moderately motion degraded despite repeat imaging. Anterior circulation: Internal carotid arteries are patent from skull base to carotid termini with motion artifact limiting assessment for stenosis though with only mild right-sided cavernous stenosis demonstrated on today's CTA. The right A1 segment is absent. The ACAs and MCAs are patent  without a gross proximal branch occlusion or high-grade proximal stenosis although assessment is limited by motion artifact. 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 Posterior circulation: Only the distal most V4 segments were included and are patent with the left being dominant. The basilar artery is widely patent. There is a patent left posterior communicating artery. Both PCAs are patent without evidence of a significant proximal stenosis. Short-segment signal loss involving the proximal left P2 segment on the second acquisition is artifactual. No aneurysm is identified. Anatomic variants: Absent right A1. IMPRESSION: 1. No acute intracranial abnormality. 2. Extensive chronic small vessel ischemic disease. 3. Motion degraded head MRA without large vessel occlusion. Electronically Signed   By: Sebastian AcheAllen  Grady M.D.   On: 04/23/2021 21:02   MR BRAIN WO CONTRAST  Result Date:  04/23/2021 CLINICAL DATA:  Stroke, follow up. Left-sided weakness and facial droop. EXAM: MRI HEAD WITHOUT CONTRAST MRA HEAD WITHOUT CONTRAST TECHNIQUE: Multiplanar, multi-echo pulse sequences of the brain and surrounding structures were acquired without intravenous contrast. Angiographic images of the Circle of Willis were acquired using MRA technique without intravenous contrast. COMPARISON:  Head CT and CTA 04/23/2021.  Head MRI 10/20/2017. FINDINGS: MRI HEAD FINDINGS The study is intermittently up to moderately motion degraded. Brain: There is no evidence of an acute infarct, intracranial hemorrhage, mass, midline shift, or extra-axial fluid collection. Small chronic infarcts are again noted in the right frontal lobe and right cerebellar hemisphere. T2 hyperintensities elsewhere in the cerebral white matter bilaterally have mildly progressed from the prior MRI and are nonspecific but compatible with extensive chronic small vessel ischemic disease. There is moderate cerebral atrophy. Vascular: Major  intracranial vascular flow voids are preserved. Skull and upper cervical spine: Unremarkable bone marrow signal. Sinuses/Orbits: Bilateral cataract extraction. Mild mucosal thickening in the paranasal sinuses. Small left mastoid effusion. Other: None. MRA HEAD FINDINGS The study is moderately motion degraded despite repeat imaging. Anterior circulation: Internal carotid arteries are patent from skull base to carotid termini with motion artifact limiting assessment for stenosis though with only mild right-sided cavernous stenosis demonstrated on today's CTA. The right A1 segment is absent. The ACAs and MCAs are patent without a gross proximal branch occlusion or high-grade proximal stenosis although assessment is limited by motion artifact. 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 Posterior circulation: Only the distal most V4 segments were included and are patent with the left being dominant. The basilar artery is widely patent. There is a patent left posterior communicating artery. Both PCAs are patent without evidence of a significant proximal stenosis. Short-segment signal loss involving the proximal left P2 segment on the second acquisition is artifactual. No aneurysm is identified. Anatomic variants: Absent right A1. IMPRESSION: 1. No acute intracranial abnormality. 2. Extensive chronic small vessel ischemic disease. 3. Motion degraded head MRA without large vessel occlusion. Electronically Signed   By: Sebastian Ache M.D.   On: 04/23/2021 21:02   DG CHEST PORT 1 VIEW  Result Date: 04/23/2021 CLINICAL DATA:  COVID. EXAM: PORTABLE CHEST 1 VIEW COMPARISON:  Chest x-ray 02/12/2021. FINDINGS: The aorta is tortuous with atherosclerotic calcifications. The heart is enlarged, unchanged. There are minimal strandy opacities in the left lung base. There is no pleural effusion or pneumothorax identified. No acute fractures are seen. IMPRESSION: 1. Minimal left basilar atelectasis. 2.  Stable cardiomegaly. 3.  Aortic Atherosclerosis (ICD10-I70.0). Electronically Signed   By: Darliss Cheney M.D.   On: 04/23/2021 21:30   CT HEAD CODE STROKE WO CONTRAST  Result Date: 04/23/2021 CLINICAL DATA:  Code stroke. Neuro deficit, acute, stroke suspected. Left facial droop and left arm tingling. EXAM: CT HEAD WITHOUT CONTRAST TECHNIQUE: Contiguous axial images were obtained from the base of the skull through the vertex without intravenous contrast. COMPARISON:  07/10/2020 FINDINGS: Brain: There is no evidence of an acute infarct, intracranial hemorrhage, mass, midline shift, or extra-axial fluid collection. There is mild-to-moderate cerebral atrophy. Patchy and confluent hypodensities in the cerebral white matter bilaterally are unchanged and nonspecific but compatible with extensive chronic small vessel ischemic disease. Small chronic infarcts are again noted in the right frontal operculum and cerebellum. Vascular: Calcified atherosclerosis at the skull base. No hyperdense vessel. Skull: No fracture or suspicious osseous lesion. Sinuses/Orbits: Mild mucosal thickening in the paranasal sinuses. Clear mastoid air cells. Bilateral  cataract extraction. Other: None. ASPECTS [facility] Stroke Program Early CT Score) - Ganglionic level infarction (caudate, lentiform nuclei, internal capsule, insula, M1-M3 cortex): 7 - Supraganglionic infarction (M4-M6 cortex): 3 Total score (0-10 with 10 being normal): 10 IMPRESSION: 1. No evidence of acute intracranial abnormality.  ASPECTS of 10. 2. Extensive chronic small vessel ischemic disease. 3. 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 These results were communicated to Dr. Otelia Limes at 5:16 pm on 04/23/2021 by text page via the [facility] messaging system. Electronically Signed   By: Sebastian Ache M.D.   On: 04/23/2021 17:16   CT ANGIO HEAD NECK W WO CM W PERF (CODE STROKE)  Result Date: 04/23/2021 CLINICAL DATA:  Neuro deficit, acute, stroke suspected. Left facial  droop and left arm tingling. EXAM: CT ANGIOGRAPHY HEAD AND NECK CT PERFUSION BRAIN TECHNIQUE: Multidetector CT imaging of the head and neck was performed using the standard protocol during bolus administration of intravenous contrast. Multiplanar CT image reconstructions and MIPs were obtained to evaluate the vascular anatomy. Carotid stenosis measurements (when applicable) are obtained utilizing NASCET criteria, using the distal internal carotid diameter as the denominator. Multiphase CT imaging of the brain was performed following IV bolus contrast injection. Subsequent parametric perfusion maps were calculated using RAPID software. CONTRAST:  OMNIPAQUE IOHEXOL 350 MG/ML SOLN COMPARISON:  None. FINDINGS: CTA NECK FINDINGS Aortic arch: Standard 3 vessel aortic arch with a moderate amount of calcified plaque. Proximally 50% stenosis of the origin of the brachiocephalic artery. Right carotid system: Patent with heavily calcified plaque at the carotid bifurcation resulting in high-grade stenosis of the origin of the ICA with quantification limited as the densely calcified plaque obscures the small residual patent lumen. Left carotid system: Patent with a moderate amount of calcified plaque at the carotid bifurcation. No evidence of a significant stenosis or dissection. Vertebral arteries: The left vertebral artery is patent and strongly dominant without evidence of a significant stenosis or dissection. The right vertebral artery is patent and small with assessment of the V1 segment limited by venous contrast. There is calcified plaque at the origin of the right vertebral artery with assessment of a suspected associated stenosis limited due to poor visualization of the lumen due to the small size of the artery and density of the calcified plaque. Skeleton: Focally advanced disc degeneration at C5-6. Mild cervical facet arthrosis. Other neck: No evidence of cervical lymphadenopathy or mass. Upper chest: No apical  lung consolidation or mass. Review of the MIP images confirms the above findings CTA HEAD FINDINGS Anterior circulation: The internal carotid arteries are patent from skull base to carotid termini with calcified plaque resulting in mild cavernous stenosis on the right. ACAs and MCAs are patent without evidence of a proximal branch occlusion or significant proximal stenosis. There is a 2 mm aneurysm projecting laterally from the left MCA bifurcation. Posterior circulation: The intracranial vertebral arteries are patent to the basilar with calcified plaque resulting in moderate left V4 stenosis. The right V4 segment is hypoplastic and diffusely irregular. The basilar artery is widely patent. There is likely a small left posterior communicating artery. Both PCAs are patent without evidence of a significant proximal stenosis. No aneurysm is identified. Venous sinuses: As permitted by contrast timing, patent. Anatomic variants: Absent right A1 segment. Review of the MIP images confirms the above findings CT Brain Perfusion Findings: The study is motion degraded. ASPECTS: 10 CBF (<30%) Volume: 0 mL Perfusion (Tmax>6.0s) volume: 14 mL Mismatch Volume: 14 mL Infarction Location: No core infarct is  identified by rapid processing. The small areas of prolonged Tmax are located in the right aspect of the posterior fossa, right occipital region, and high right frontal region and may be artifactual given involvement of multiple vascular territories and motion. IMPRESSION: 1. No emergent large vessel occlusion. 2. 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 3. Mild intracranial atherosclerosis including mild right cavernous ICA and left V4 stenoses. 4. 2 mm left MCA bifurcation aneurysm. 5. Motion degraded CTP without evidence of an acute core infarct. Small foci of possible penumbra may be artifactual. 6.  Aortic Atherosclerosis (ICD10-I70.0).  Emergent results were communicated to Dr. Otelia Limes at 5:39 pm on 04/23/2021 by text page via the [facility] messaging system. Electronically Signed   By: Sebastian Ache M.D.   On: 04/23/2021 17:54        Scheduled Meds:  aspirin EC  325 mg Oral Daily   atorvastatin  40 mg Oral QHS   clopidogrel  300 mg Oral Once   Followed by   [START ON 04/25/2021] clopidogrel  75 mg Oral Daily   enoxaparin (LOVENOX) injection  40 mg Subcutaneous Q24H   insulin aspart  0-9 Units Subcutaneous Q4H   Continuous Infusions:  lactated ringers 100 mL/hr at 04/23/21 2313   remdesivir 100 mg in NS 100 mL       LOS: 0 days    Time spent: 35 minutes    Lavaris Sexson A Dnaiel Voller, MD [facility]   If 7PM-7AM, please contact night-coverage [email or web address]  04/24/2021, 8:47 AM

## 2021-04-24 NOTE — ED Notes (Signed)
US at bedside

## 2021-04-24 NOTE — ED Notes (Signed)
Admitting provider at bedside.

## 2021-04-24 NOTE — ED Notes (Signed)
Unable to perform stroke swallow screen d/t LOC of patient

## 2021-04-24 NOTE — Progress Notes (Signed)
Physical Therapy Evaluation Patient Details Name: Bobby Rojas MRN: [id number] DOB: February 27, 1934 Today's Date: 04/24/2021   History of Present Illness  Pt is a 87yo male presenting to [facility] ED on 9/6 from SNF secondary to LUE weakness and L facial droop. Imaging negative for acute findings. Found to be + COVID-19 in ED. PMH: deaf, HTN, DM, dementia  Clinical Impression  Pt presents with the impairments above and problems listed below. Attempted to utilize ASL translator on iPad but pt was unable to respond to video interpretation. Utilized gestures to have pt perform bed mobility tasks. Pt required mod assist +2 for rolling for linen change and medication application by RN. Further mobility limited secondary to lethargy. Unable to take full subjective information and home environment secondary to language barrier and lethargy; however, per notes pt from SNF. Recommending return to SNF to address current impairments. We will continue to follow him acutely to promote independence with functional mobiltiy.     Follow Up Recommendations SNF (Return to previous facility.)    Equipment Recommendations  Other (comment) (Defer to SNF)    Recommendations for Other Services       Precautions / Restrictions Precautions Precautions: Fall Restrictions Weight Bearing Restrictions: No      Mobility  Bed Mobility Overal bed mobility: Needs Assistance Bed Mobility: Rolling Rolling: Mod assist;+2 for safety/equipment         General bed mobility comments: Pt rolled in bed x2 to each side for linen/brief change; mod assist +2 for physical assistance.    Transfers                 General transfer comment: Deferred secondary to lethargy.  Ambulation/Gait             General Gait Details: Deferred secondary to lethargy.  Stairs            Wheelchair Mobility    Modified Rankin (Stroke Patients Only)       Balance                                              Pertinent Vitals/Pain Pain Assessment: Faces Faces Pain Scale: No hurt    Home Living Family/patient expects to be discharged to:: Skilled nursing facility                      Prior Function Level of Independence: Needs assistance         Comments: Per chart review, family reports pt able to ambulate but does not specify if he uses AD. Unsure of PLOF with ADLs but likely needs some level of assistance.     Hand Dominance        Extremity/Trunk Assessment   Upper Extremity Assessment Upper Extremity Assessment: Defer to OT evaluation    Lower Extremity Assessment Lower Extremity Assessment: Difficult to assess due to impaired cognition;Generalized weakness       Communication   Communication: Interpreter utilized Fleet Contras [phone]. Interpreter unable to ilicit response from pt. RN in room and reported this happened before.)  Cognition Arousal/Alertness: Lethargic Behavior During Therapy: Flat affect Overall Cognitive Status: No family/caregiver present to determine baseline cognitive functioning                                 General Comments:  Pt drowsy and lethargic during session; had to be awakened several times. Pt has history of dementia. Pt did not respond well to ipad interpreter services.      General Comments General comments (skin integrity, edema, etc.): No family present    Exercises     Assessment/Plan    PT Assessment Patient needs continued PT services  PT Problem List Decreased cognition;Decreased safety awareness;Cardiopulmonary status limiting activity;Decreased activity tolerance;Decreased strength;Decreased balance;Decreased mobility       PT Treatment Interventions DME instruction;Gait training;Functional mobility training;Therapeutic activities;Therapeutic exercise;Balance training;Neuromuscular re-education;Cognitive remediation;Patient/family education    PT Goals (Current goals can be found in the Care Plan  section)  Acute Rehab PT Goals Patient Stated Goal: none stated PT Goal Formulation: Patient unable to participate in goal setting Time For Goal Achievement: 05/08/21 Potential to Achieve Goals: Good    Frequency Min 2X/week   Barriers to discharge        Co-evaluation               AM-PAC PT "6 Clicks" Mobility  Outcome Measure Help needed turning from your back to your side while in a flat bed without using bedrails?: A Lot Help needed moving from lying on your back to sitting on the side of a flat bed without using bedrails?: A Lot Help needed moving to and from a bed to a chair (including a wheelchair)?: A Lot Help needed standing up from a chair using your arms (e.g., wheelchair or bedside chair)?: A Lot Help needed to walk in hospital room?: Total Help needed climbing 3-5 steps with a railing? : Total 6 Click Score: 10    End of Session   Activity Tolerance: Patient limited by lethargy Patient left: in bed;with call bell/phone within reach;with nursing/sitter in room Nurse Communication: Mobility status PT Visit Diagnosis: Muscle weakness (generalized) (M62.81)    Time: 3149-7026 PT Time Calculation (min) (ACUTE ONLY): 29 min   Charges:   PT Evaluation $PT Eval Moderate Complexity: 1 Mod PT Treatments $Therapeutic Activity: 8-22 mins        Johnn Hai, SPT Johnn Hai 04/24/2021, 2:14 PM

## 2021-04-24 NOTE — ED Notes (Signed)
Pt difficult to awaken, pt is hearing impaired, unable to use ASL interpreter d/t patient sleeping.

## 2021-04-24 NOTE — ED Notes (Signed)
PT at bedside.

## 2021-04-24 NOTE — Progress Notes (Signed)
RT Note: Called about patient desaturation on RA. Per RN NRB placed and copious amount of thick oral secretions suctioned. Upon arrival to room patient is on NRB Spo2 98%. No distress noted. Discussed with RN to continue with NRB at this time and wean O2/device as tolerated.

## 2021-04-24 NOTE — Consult Note (Addendum)
Hospital Consult    Reason for Consult:  symptomatic right ICA stenosis Requesting Physician:  Julian Reil MRN #:  [id number]  History of Present Illness: This is a 87 y.o. male who resides in SNF and has advancing dementia, is deaf and mute who presented to the hospital with left arm weakness and left facial droop.  The pt normally speaks via sign language but not able to communicate with him via interpreter, therefore, history obtained from the chart.  Dr. Chestine Spore to call family.  Of note, pt found to be covid + on admission.  The pt is on a statin for cholesterol management.  The pt is on a daily aspirin.   Other AC:  Plavix and Lovenox The pt is on CCB, ACEI for hypertension.   The pt is not diabetic.   Tobacco hx:  former (cigars)  Past Medical History:  Diagnosis Date   Atherosclerosis    CAD (coronary artery disease)    Deaf    bilateral   Depression    Diabetes mellitus without complication (HCC)    Gastroenteritis    Hypercholesterolemia    Hypertension    Hypothyroid    MCI (mild cognitive impairment)    OSA (obstructive sleep apnea)     Past Surgical History:  Procedure Laterality Date   BLADDER SURGERY     CATARACT EXTRACTION, BILATERAL     ELBOW FRACTURE SURGERY     KNEE SURGERY Right     No Known Allergies  Prior to Admission medications   Medication Sig Start Date End Date Taking? Authorizing Provider  amLODipine (NORVASC) 5 MG tablet Take 5 mg by mouth daily. 03/19/21  Yes [provider]  aspirin EC 81 MG tablet Take 81 mg by mouth daily. Swallow whole.   Yes [provider]  atorvastatin (LIPITOR) 40 MG tablet Take 40 mg by mouth at bedtime. 08/23/18  Yes [provider]  Cholecalciferol (VITAMIN D3) 25 MCG (1000 UT) CAPS Take 1,000 Units by mouth daily.   Yes [provider]  divalproex (DEPAKOTE SPRINKLE) 125 MG capsule Take 375 mg by mouth 2 (two) times daily. 03/19/21  Yes [provider]  levothyroxine  (SYNTHROID) 75 MCG tablet Take 75 mcg by mouth daily before breakfast. 08/01/15  Yes [provider]  lisinopril (ZESTRIL) 40 MG tablet Take 40 mg by mouth daily. 10/14/18  Yes [provider]  memantine (NAMENDA) 10 MG tablet Take 10 mg by mouth 2 (two) times daily. 11/12/18  Yes [provider]  risperiDONE (RISPERDAL) 0.25 MG tablet Take 0.25 mg by mouth 3 (three) times daily. 03/25/21  Yes [provider]  rOPINIRole (REQUIP) 0.5 MG tablet Take 0.5 mg by mouth at bedtime. 10/29/18  Yes [provider]    Social History   Socioeconomic History   Marital status: Married    Spouse name: Not on file   Number of children: 3   Years of education: Not on file   Highest education level: 6th grade  Occupational History   Not on file  Tobacco Use   Smoking status: Former    Types: Cigars   Smokeless tobacco: Never  Substance and Sexual Activity   Alcohol use: Not Currently   Drug use: Never   Sexual activity: Not on file  Other Topics Concern   Not on file  Social History Narrative   Lives with wife at home   Social Determinants of Health   Financial Resource Strain: Not on file  Food Insecurity: Not  on file  Transportation Needs: Not on file  Physical Activity: Not on file  Stress: Not on file  Social Connections: Not on file  Intimate Partner Violence: Not on file     No family history on file. And unable to obtain from pt.  ROS: [x]  Positive   [ ]  Negative  Obtained from chart as not able to communicate with pt Cardiac: []  chest pain/pressure []  palpitations []  SOB lying flat []  DOE  Vascular: []  pain in legs while walking []  pain in legs at rest []  pain in legs at night []  non-healing ulcers []  hx of DVT []  swelling in legs  Pulmonary: []  asthma/wheezing []  home O2 [X]  covid +  Neurologic: [x]  hx of CVA/TIA   Hematologic: []  hx of cancer  Endocrine:   []  diabetes []  thyroid disease  GI []  GERD  GU: []   CKD/renal failure []  HD--[]  M/W/F or []  T/T/S  Psychiatric: []  anxiety []  depression  Musculoskeletal: []  arthritis []  joint pain  Integumentary: []  rashes []  ulcers  Constitutional: []  fever  []  chills  Physical Examination  Vitals:   04/24/21 1200 04/24/21 1230  BP: (!) 155/68 (!) 151/71  Pulse: 68 68  Resp: 17 19  Temp:    SpO2: 93% 92%   Body mass index is 28.11 kg/m.  General:  WDWN in NAD; awakes to touch Gait: Not observed HENT: WNL, normocephalic Pulmonary: normal non-labored breathing Cardiac: regular Abdomen:  soft, NT/ND; aortic pulse is not palpable Skin: without rashes Vascular Exam/Pulses:  Right Left  Radial 2+ (normal) 2+ (normal)  DP 2+ (normal) 2+ (normal)   Extremities: without ischemic changes, without Gangrene , without cellulitis; without open wounds Musculoskeletal: no muscle wasting or atrophy  Neurologic: A&O X 3; speech is fluent/normal Psychiatric:  unable to assess  CBC    Component Value Date/Time   WBC 8.1 04/23/2021 1649   RBC 5.27 04/23/2021 1649   HGB 16.3 04/23/2021 1659   HCT 48.0 04/23/2021 1659   PLT 309 04/23/2021 1649   MCV 94.5 04/23/2021 1649   MCH 30.9 04/23/2021 1649   MCHC 32.7 04/23/2021 1649   RDW 13.5 04/23/2021 1649   LYMPHSABS 0.8 04/23/2021 1649   MONOABS 1.1 (H) 04/23/2021 1649   EOSABS 0.0 04/23/2021 1649   BASOSABS 0.0 04/23/2021 1649    BMET    Component Value Date/Time   NA 140 04/23/2021 1659   K 4.3 04/23/2021 1659   CL 104 04/23/2021 1659   CO2 26 04/23/2021 1649   GLUCOSE 128 (H) 04/23/2021 1659   BUN 26 (H) 04/23/2021 1659   CREATININE 1.30 (H) 04/23/2021 1659   CALCIUM 8.8 (L) 04/23/2021 1649   GFRNONAA 53 (L) 04/23/2021 1649    COAGS: Lab Results  Component Value Date   INR 1.2 04/23/2021     Non-Invasive Vascular Imaging:   CTA head/neck 04/23/2021 IMPRESSION: 1. No emergent large vessel occlusion. 2. High-grade stenosis of the right ICA at its origin  with quantification limited by the heavily calcified nature of the plaque. 3. Mild intracranial atherosclerosis including mild right cavernousICA and left V4 stenoses. 4. 2 mm left MCA bifurcation aneurysm. 5. Motion degraded CTP without evidence of an acute core infarct. Small foci of possible penumbra may be artifactual. 6.  Aortic Atherosclerosis (ICD10-I70.0).   ASSESSMENT/PLAN: This is a 87 y.o. male with symptomatic right ICA stenosis.  -per consult and chart, pt had left arm weakness and numbness at SNF he resides in and was sent to hospital for  evaluation.  He was found to have right ICA stenosis.  Pt unable to communicate with interpreter on screen or in person.  Dr. Chestine Spore to call and speak with family.  Pt is currently moving all extremities on basic command. -continue asa/statin/plavix   Doreatha Massed, PA-C [facility] [phone]   I have seen and evaluated the patient. I agree with the PA note as documented above.  87 year old male that vascular surgery has been consulted for symptomatic high-grade right ICA stenosis.  Patient is deaf, mute, demented and lives in assisted living memory care unit after discussing with the daughter in law today.  Was noted to have left sided weakness that resolved in the ED last night.  MRI did not show any evidence of acute infarct.  CT scan shows heavy calcification in the carotid bifurcation on the right and the exact degree of narrowing is somewhat difficult to quantify.  Discussed with his daughter-in-law on the phone options of medical management versus carotid revascularization for stroke risk, reduction.  Discussed that I would favor TCAR given all of his comorbidities/age/debilitated overall condition if they elected to undergo surgery.  Discussed goal of surgery is stroke risk reduction long-term.  Discussed 1% risk of perioperative stroke.  We will allow him to complete his work-up and he has a carotid duplex ordered  today that we will follow.  The daughter-in-law would like to talk to the rest of the family and make a decision apparently the son is traveling to [place] at this time.  Cephus Shelling, MD [facility] [place] Office: [phone]

## 2021-04-24 NOTE — ED Notes (Signed)
CBG 94 

## 2021-04-24 NOTE — Progress Notes (Addendum)
STROKE TEAM PROGRESS NOTE   INTERVAL HISTORY His interpretation tablet is at the bedside. We attempted to communicate with this ASL interpreter; however, patient not communicating despite best efforts. Per chart review, the patient only communicates consistently with his son-in-law in person, but last evening the ASL interpreter was able to communicate and noted comprehension deficits. Has not historically been able to lip read.   Bobby Rojas, a SNF resident at [facility], presented with left sided weakness, tingling, and left facial droop 9/6. These symptoms improved while in CT scan.  He carries past medical history of deafness, CAD, DM2, HTN, HLD, and dementia. His initial NIH was 4 during stroke code. Incidentally found to be COVID+ and he is vaccinated per daughter.   No new neurological deficits overnight and no clinical events.     Vitals:   04/24/21 0000 04/24/21 0200 04/24/21 0505 04/24/21 0800  BP: (!) 150/87 (!) 162/75 (!) 151/76 (!) 147/69  Pulse: 83 77 74 72  Resp: (!) 23 (!) 23 (!) 22 19  Temp:      TempSrc:      SpO2: 95% 94% 94% 97%  Weight:      Height:       CBC:  Recent Labs  Lab 04/23/21 1649 04/23/21 1659  WBC 8.1  --   NEUTROABS 6.1  --   HGB 16.3 16.3  HCT 49.8 48.0  MCV 94.5  --   PLT 309  --    Basic Metabolic Panel:  Recent Labs  Lab 04/23/21 1649 04/23/21 1659  NA 138 140  K 4.7 4.3  CL 105 104  CO2 26  --   GLUCOSE 132* 128*  BUN 22 26*  CREATININE 1.32* 1.30*  CALCIUM 8.8*  --    Lipid Panel:  Recent Labs  Lab 04/24/21 0436  CHOL 122  TRIG 61  HDL 42  CHOLHDL 2.9  VLDL 12  LDLCALC 68   HgbA1c:  Recent Labs  Lab 04/24/21 0434  HGBA1C 6.0*   Urine Drug Screen:  Recent Labs  Lab 04/24/21 0203  LABOPIA NONE DETECTED  COCAINSCRNUR NONE DETECTED  LABBENZ NONE DETECTED  AMPHETMU NONE DETECTED  THCU NONE DETECTED  LABBARB NONE DETECTED    Alcohol Level No results for input(s): ETH in the last 168 hours.  IMAGING  past 24 hours MR ANGIO HEAD WO CONTRAST  Result Date: 04/23/2021 CLINICAL DATA:  Stroke, follow up. Left-sided weakness and facial droop. EXAM: MRI HEAD WITHOUT CONTRAST MRA HEAD WITHOUT CONTRAST TECHNIQUE: Multiplanar, multi-echo pulse sequences of the brain and surrounding structures were acquired without intravenous contrast. Angiographic images of the Circle of Willis were acquired using MRA technique without intravenous contrast. COMPARISON:  Head CT and CTA 04/23/2021.  Head MRI 10/20/2017. FINDINGS: MRI HEAD FINDINGS The study is intermittently up to moderately motion degraded. Brain: There is no evidence of an acute infarct, intracranial hemorrhage, mass, midline shift, or extra-axial fluid collection. Small chronic infarcts are again noted in the right frontal lobe and right cerebellar hemisphere. T2 hyperintensities elsewhere in the cerebral white matter bilaterally have mildly progressed from the prior MRI and are nonspecific but compatible with extensive chronic small vessel ischemic disease. There is moderate cerebral atrophy. Vascular: Major intracranial vascular flow voids are preserved. Skull and upper cervical spine: Unremarkable bone marrow signal. Sinuses/Orbits: Bilateral cataract extraction. Mild mucosal thickening in the paranasal sinuses. Small left mastoid effusion. Other: None. MRA HEAD FINDINGS The study is moderately motion degraded despite repeat imaging. Anterior circulation: Internal carotid  arteries are patent from skull base to carotid termini with motion artifact limiting assessment for stenosis though with only mild right-sided cavernous stenosis demonstrated on today's CTA. The right A1 segment is absent. The ACAs and MCAs are patent without a gross proximal branch occlusion or high-grade proximal stenosis although assessment is limited by motion artifact. 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 Posterior circulation: Only the distal most V4  segments were included and are patent with the left being dominant. The basilar artery is widely patent. There is a patent left posterior communicating artery. Both PCAs are patent without evidence of a significant proximal stenosis. Short-segment signal loss involving the proximal left P2 segment on the second acquisition is artifactual. No aneurysm is identified. Anatomic variants: Absent right A1. IMPRESSION: 1. No acute intracranial abnormality. 2. Extensive chronic small vessel ischemic disease. 3. Motion degraded head MRA without large vessel occlusion. Electronically Signed   By: Sebastian Ache M.D.   On: 04/23/2021 21:02   MR BRAIN WO CONTRAST  Result Date: 04/23/2021 CLINICAL DATA:  Stroke, follow up. Left-sided weakness and facial droop. EXAM: MRI HEAD WITHOUT CONTRAST MRA HEAD WITHOUT CONTRAST TECHNIQUE: Multiplanar, multi-echo pulse sequences of the brain and surrounding structures were acquired without intravenous contrast. Angiographic images of the Circle of Willis were acquired using MRA technique without intravenous contrast. COMPARISON:  Head CT and CTA 04/23/2021.  Head MRI 10/20/2017. FINDINGS: MRI HEAD FINDINGS The study is intermittently up to moderately motion degraded. Brain: There is no evidence of an acute infarct, intracranial hemorrhage, mass, midline shift, or extra-axial fluid collection. Small chronic infarcts are again noted in the right frontal lobe and right cerebellar hemisphere. T2 hyperintensities elsewhere in the cerebral white matter bilaterally have mildly progressed from the prior MRI and are nonspecific but compatible with extensive chronic small vessel ischemic disease. There is moderate cerebral atrophy. Vascular: Major intracranial vascular flow voids are preserved. Skull and upper cervical spine: Unremarkable bone marrow signal. Sinuses/Orbits: Bilateral cataract extraction. Mild mucosal thickening in the paranasal sinuses. Small left mastoid effusion. Other: None. MRA  HEAD FINDINGS The study is moderately motion degraded despite repeat imaging. Anterior circulation: Internal carotid arteries are patent from skull base to carotid termini with motion artifact limiting assessment for stenosis though with only mild right-sided cavernous stenosis demonstrated on today's CTA. The right A1 segment is absent. The ACAs and MCAs are patent without a gross proximal branch occlusion or high-grade proximal stenosis although assessment is limited by motion artifact. 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 Posterior circulation: Only the distal most V4 segments were included and are patent with the left being dominant. The basilar artery is widely patent. There is a patent left posterior communicating artery. Both PCAs are patent without evidence of a significant proximal stenosis. Short-segment signal loss involving the proximal left P2 segment on the second acquisition is artifactual. No aneurysm is identified. Anatomic variants: Absent right A1. IMPRESSION: 1. No acute intracranial abnormality. 2. Extensive chronic small vessel ischemic disease. 3. Motion degraded head MRA without large vessel occlusion. Electronically Signed   By: Sebastian Ache M.D.   On: 04/23/2021 21:02   DG CHEST PORT 1 VIEW  Result Date: 04/23/2021 CLINICAL DATA:  COVID. EXAM: PORTABLE CHEST 1 VIEW COMPARISON:  Chest x-ray 02/12/2021. FINDINGS: The aorta is tortuous with atherosclerotic calcifications. The heart is enlarged, unchanged. There are minimal strandy opacities in the left lung base. There is no pleural effusion  or pneumothorax identified. No acute fractures are seen. IMPRESSION: 1. Minimal left basilar atelectasis. 2. Stable cardiomegaly. 3.  Aortic Atherosclerosis (ICD10-I70.0). Electronically Signed   By: Darliss Cheney M.D.   On: 04/23/2021 21:30   CT HEAD CODE STROKE WO CONTRAST  Result Date: 04/23/2021 CLINICAL DATA:  Code stroke. Neuro deficit, acute, stroke  suspected. Left facial droop and left arm tingling. EXAM: CT HEAD WITHOUT CONTRAST TECHNIQUE: Contiguous axial images were obtained from the base of the skull through the vertex without intravenous contrast. COMPARISON:  07/10/2020 FINDINGS: Brain: There is no evidence of an acute infarct, intracranial hemorrhage, mass, midline shift, or extra-axial fluid collection. There is mild-to-moderate cerebral atrophy. Patchy and confluent hypodensities in the cerebral white matter bilaterally are unchanged and nonspecific but compatible with extensive chronic small vessel ischemic disease. Small chronic infarcts are again noted in the right frontal operculum and cerebellum. Vascular: Calcified atherosclerosis at the skull base. No hyperdense vessel. Skull: No fracture or suspicious osseous lesion. Sinuses/Orbits: Mild mucosal thickening in the paranasal sinuses. Clear mastoid air cells. Bilateral cataract extraction. Other: None. ASPECTS [facility] Stroke Program Early CT Score) - Ganglionic level infarction (caudate, lentiform nuclei, internal capsule, insula, M1-M3 cortex): 7 - Supraganglionic infarction (M4-M6 cortex): 3 Total score (0-10 with 10 being normal): 10 IMPRESSION: 1. No evidence of acute intracranial abnormality.  ASPECTS of 10. 2. Extensive chronic small vessel ischemic disease. 3. 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 These results were communicated to Dr. Otelia Limes at 5:16 pm on 04/23/2021 by text page via the [facility] messaging system. Electronically Signed   By: Sebastian Ache M.D.   On: 04/23/2021 17:16   CT ANGIO HEAD NECK W WO CM W PERF (CODE STROKE)  Result Date: 04/23/2021 CLINICAL DATA:  Neuro deficit, acute, stroke suspected. Left facial droop and left arm tingling. EXAM: CT ANGIOGRAPHY HEAD AND NECK CT PERFUSION BRAIN TECHNIQUE: Multidetector CT imaging of the head and neck was performed using the standard protocol during bolus administration of intravenous contrast. Multiplanar CT image  reconstructions and MIPs were obtained to evaluate the vascular anatomy. Carotid stenosis measurements (when applicable) are obtained utilizing NASCET criteria, using the distal internal carotid diameter as the denominator. Multiphase CT imaging of the brain was performed following IV bolus contrast injection. Subsequent parametric perfusion maps were calculated using RAPID software. CONTRAST:  OMNIPAQUE IOHEXOL 350 MG/ML SOLN COMPARISON:  None. FINDINGS: CTA NECK FINDINGS Aortic arch: Standard 3 vessel aortic arch with a moderate amount of calcified plaque. Proximally 50% stenosis of the origin of the brachiocephalic artery. Right carotid system: Patent with heavily calcified plaque at the carotid bifurcation resulting in high-grade stenosis of the origin of the ICA with quantification limited as the densely calcified plaque obscures the small residual patent lumen. Left carotid system: Patent with a moderate amount of calcified plaque at the carotid bifurcation. No evidence of a significant stenosis or dissection. Vertebral arteries: The left vertebral artery is patent and strongly dominant without evidence of a significant stenosis or dissection. The right vertebral artery is patent and small with assessment of the V1 segment limited by venous contrast. There is calcified plaque at the origin of the right vertebral artery with assessment of a suspected associated stenosis limited due to poor visualization of the lumen due to the small size of the artery and density of the calcified plaque. Skeleton: Focally advanced disc degeneration at C5-6. Mild cervical facet arthrosis. Other neck: No evidence of cervical lymphadenopathy or mass. Upper chest: No apical lung consolidation  or mass. Review of the MIP images confirms the above findings CTA HEAD FINDINGS Anterior circulation: The internal carotid arteries are patent from skull base to carotid termini with calcified plaque resulting in mild cavernous stenosis  on the right. ACAs and MCAs are patent without evidence of a proximal branch occlusion or significant proximal stenosis. There is a 2 mm aneurysm projecting laterally from the left MCA bifurcation. Posterior circulation: The intracranial vertebral arteries are patent to the basilar with calcified plaque resulting in moderate left V4 stenosis. The right V4 segment is hypoplastic and diffusely irregular. The basilar artery is widely patent. There is likely a small left posterior communicating artery. Both PCAs are patent without evidence of a significant proximal stenosis. No aneurysm is identified. Venous sinuses: As permitted by contrast timing, patent. Anatomic variants: Absent right A1 segment. Review of the MIP images confirms the above findings CT Brain Perfusion Findings: The study is motion degraded. ASPECTS: 10 CBF (<30%) Volume: 0 mL Perfusion (Tmax>6.0s) volume: 14 mL Mismatch Volume: 14 mL Infarction Location: No core infarct is identified by rapid processing. The small areas of prolonged Tmax are located in the right aspect of the posterior fossa, right occipital region, and high right frontal region and may be artifactual given involvement of multiple vascular territories and motion. IMPRESSION: 1. No emergent large vessel occlusion. 2. 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 3. Mild intracranial atherosclerosis including mild right cavernous ICA and left V4 stenoses. 4. 2 mm left MCA bifurcation aneurysm. 5. Motion degraded CTP without evidence of an acute core infarct. Small foci of possible penumbra may be artifactual. 6.  Aortic Atherosclerosis (ICD10-I70.0). Emergent results were communicated to Dr. Otelia LimesLindzen at 5:39 pm on 04/23/2021 by text page [facility]. Electronically Signed   By: Sebastian AcheAllen  Grady M.D.   On: 04/23/2021 17:54    PHYSICAL EXAM  Temp:  [99.2 F (37.3 C)] 99.2 F (37.3 C) (09/06  1737) Pulse Rate:  [68-87] 68 (09/07 1230) Resp:  [12-29] 19 (09/07 1230) BP: (135-163)/(65-91) 151/71 (09/07 1230) SpO2:  [91 %-100 %] 92 % (09/07 1230) Weight:  [94 kg] 94 kg (09/06 1737)  General - Well nourished, well developed, in no apparent distress.  Ophthalmologic - no fundoscopic examination performed.   Cardiovascular - Regular rhythm and rate.  Mental Status -  Level of arousal intact, alert  Unable to assess orientation due to encephalopathy, baseline dementia, language barrier, but did not communicate with ASL interpreter via video chat either   Cranial Nerves II - XII - II - Visual field intact OU, blinks to threat bilaterally . III, IV, VI - Extraocular movements intact, tracks across room. V - unable to assess VII - Facial movement intact bilaterally to muscle activation, mimics to smile  VIII - bilateral deafness , complete  X - unable to assess . XI - unable to assess  XII - unable to assess   Motor Strength - mimics to raise all 4 limbs against gravity, seemingly symmetrically.  Bulk was normal and fasciculations were absent.   Motor Tone - Muscle tone was assessed at the neck and appendages and were normal.  Sensory - unable to test   Coordination - no obvious dysmetria or dyssynergia.  Tremor was absent.  Gait and Station - deferred.   ASSESSMENT/PLAN Mr. Marva PandaBuddy Rojas is a 87 y.o. male with history of dementia (?sensory deprivation secondary to deafness), DM2, HTN, HLD,  CAD. He presents with acute onset left arm weakness and left facial droop with subjective complaint of left arm tingling on 9/6 and seen as stroke code. Examination improved over hour(s) and he appears back to baseline, although assessment is quite limited due to underlying dementia, language barrier, and questionable superimposed encephalopathy.   #TIA: right MCA TIA, likely due to right carotid high-grade stenosis   Transient left arm weakness, left facial droop without radiographic  evidence of acute ischemia. There is extensive small vessel disease in periventricular areas especially, chronic right frontal and bilateral cerebellar infarcts, and atrophy of brain imaging.  CTA head & neck right ICA stenosis at origin, but with calcification most predominant feature.  MRI  no acute findings. MRA  incidental left MCA bifurcation aneurysm, seen also on CTA. Absent right A1 Carotid Doppler unremarkable 2D Echo: LVEF 60-65%, no wall motion abnormalities, grade I diastolic dysfunction, degenerative MV, trivial MV regurg, calcified aortic valve, moderate aortic valve regurgitation,  LDL 68 HgbA1c 6.0 VTE prophylaxis - lovenox 40 qd  aspirin 81 mg daily prior to admission, now on ASA 300 PR given no po access Therapy recommendations: pending Disposition:  pending  Hypertension Home meds: amlodipine 5mg , lisinopril 40mg  qd Holding both antihypertensives in acute setting  Stable in 140-150s currently  Permissive hypertension (OK if < 220/120) but gradually normalize in 5-7 days Long-term BP goal normotensive, although may require higher normal Bps in setting of carotid stenosis if becomes symptomatic.   Hyperlipidemia Home meds:  atorvastatin 40mg , resumed in hospital LDL 68, goal < 70 High intensity statin above  Continue statin at discharge  Diabetes type II Controlled Home meds:  none  HgbA1c 6.0, goal < 7.0 CBGs SSI  Other Stroke Risk Factors Advanced Age >/= 61  Hx stroke  Coronary artery disease  Other Active Problems Active Sars-CoV2 infection Decadron, remdesivir, and care per primary team  O2 saturation dropping compared to yesterday Dementia Continue home namenda Vitamin d deficiency Continue supplementation  Restless Leg Syndrome Continue home ropinerole when able  Hypothyroidism Continue home levothyroxine   Hospital day # 0  ATTENDING NOTE: I reviewed above note and agree with the assessment and plan. Pt was seen and examined.   Call  87 year old male with history of deafness, mute, CAD, diabetes, hyperlipidemia, hypertension, MCI and OSA admitted for left-sided weakness, left facial droop and left arm tingling.  Symptoms resolved overnight.  CT showed no acute abnormality, old right frontal and right cerebellum infarcts.  CTA head and neck right ICA high-grade stenosis, right ICA siphon moderate stenosis.  MRI no acute abnormality.  Carotid Doppler unremarkable.  EF 60 to 65%.  UDS negative.  A1c 6.0, LDL 68.  Creatinine 1.30.  COVID PCR positive.  Etiology for patient right brain TIA likely due to right ICA high-grade stenosis.  Had vascular surgery consultation with Dr. , plan for right TCAR versus medical treatment.  Per Dr. 76 notes, patient daughter-in-law would like to discuss with patient's son who is traveling to [place] at this time, and will make decision hopefully tomorrow.  In meantime, recommend continue aspirin 300 PR for antiplatelet treatment.  Once patient passed swallow or has p.o. access, recommend aspirin 325, Plavix 75 and Lipitor 40 for stroke management.  COVID treatment per primary team.  We will follow.  For detailed assessment and plan, please refer to above as I have made changes wherever appropriate.   Chestine Spore, MD PhD Stroke Neurology 04/24/2021 5:44 PM    To contact Stroke Continuity  provider, please refer to [email or web address] After hours, contact General Neurology

## 2021-04-24 NOTE — ED Notes (Signed)
Did not attempt to have patient drink, pt unable to lick lips or puff cheeks

## 2021-04-25 DIAGNOSIS — Z79890 Hormone replacement therapy (postmenopausal): Secondary | ICD-10-CM | POA: Diagnosis not present

## 2021-04-25 DIAGNOSIS — R4701 Aphasia: Secondary | ICD-10-CM

## 2021-04-25 DIAGNOSIS — G459 Transient cerebral ischemic attack, unspecified: Secondary | ICD-10-CM | POA: Diagnosis present

## 2021-04-25 DIAGNOSIS — E7800 Pure hypercholesterolemia, unspecified: Secondary | ICD-10-CM | POA: Diagnosis present

## 2021-04-25 DIAGNOSIS — R29704 NIHSS score 4: Secondary | ICD-10-CM | POA: Diagnosis present

## 2021-04-25 DIAGNOSIS — G9341 Metabolic encephalopathy: Secondary | ICD-10-CM | POA: Diagnosis not present

## 2021-04-25 DIAGNOSIS — I2510 Atherosclerotic heart disease of native coronary artery without angina pectoris: Secondary | ICD-10-CM | POA: Diagnosis present

## 2021-04-25 DIAGNOSIS — F32A Depression, unspecified: Secondary | ICD-10-CM | POA: Diagnosis present

## 2021-04-25 DIAGNOSIS — I639 Cerebral infarction, unspecified: Secondary | ICD-10-CM | POA: Diagnosis present

## 2021-04-25 DIAGNOSIS — J1282 Pneumonia due to coronavirus disease 2019: Secondary | ICD-10-CM | POA: Diagnosis present

## 2021-04-25 DIAGNOSIS — E039 Hypothyroidism, unspecified: Secondary | ICD-10-CM | POA: Diagnosis present

## 2021-04-25 DIAGNOSIS — F0390 Unspecified dementia without behavioral disturbance: Secondary | ICD-10-CM | POA: Diagnosis present

## 2021-04-25 DIAGNOSIS — Z66 Do not resuscitate: Secondary | ICD-10-CM | POA: Diagnosis present

## 2021-04-25 DIAGNOSIS — H9193 Unspecified hearing loss, bilateral: Secondary | ICD-10-CM

## 2021-04-25 DIAGNOSIS — R29810 Facial weakness: Secondary | ICD-10-CM | POA: Diagnosis present

## 2021-04-25 DIAGNOSIS — Z79899 Other long term (current) drug therapy: Secondary | ICD-10-CM | POA: Diagnosis not present

## 2021-04-25 DIAGNOSIS — J90 Pleural effusion, not elsewhere classified: Secondary | ICD-10-CM | POA: Diagnosis present

## 2021-04-25 DIAGNOSIS — I10 Essential (primary) hypertension: Secondary | ICD-10-CM | POA: Diagnosis present

## 2021-04-25 DIAGNOSIS — Z7984 Long term (current) use of oral hypoglycemic drugs: Secondary | ICD-10-CM | POA: Diagnosis not present

## 2021-04-25 DIAGNOSIS — J9601 Acute respiratory failure with hypoxia: Secondary | ICD-10-CM | POA: Diagnosis present

## 2021-04-25 DIAGNOSIS — H913 Deaf nonspeaking, not elsewhere classified: Secondary | ICD-10-CM | POA: Diagnosis present

## 2021-04-25 DIAGNOSIS — E119 Type 2 diabetes mellitus without complications: Secondary | ICD-10-CM | POA: Diagnosis present

## 2021-04-25 DIAGNOSIS — Z87891 Personal history of nicotine dependence: Secondary | ICD-10-CM | POA: Diagnosis not present

## 2021-04-25 DIAGNOSIS — U071 COVID-19: Secondary | ICD-10-CM | POA: Diagnosis present

## 2021-04-25 DIAGNOSIS — G8194 Hemiplegia, unspecified affecting left nondominant side: Secondary | ICD-10-CM | POA: Diagnosis present

## 2021-04-25 DIAGNOSIS — G4733 Obstructive sleep apnea (adult) (pediatric): Secondary | ICD-10-CM | POA: Diagnosis present

## 2021-04-25 DIAGNOSIS — I6521 Occlusion and stenosis of right carotid artery: Secondary | ICD-10-CM | POA: Diagnosis not present

## 2021-04-25 LAB — CBC
HCT: 49.2 % (ref 39.0–52.0)
Hemoglobin: 16.3 g/dL (ref 13.0–17.0)
MCH: 30.5 pg (ref 26.0–34.0)
MCHC: 33.1 g/dL (ref 30.0–36.0)
MCV: 92 fL (ref 80.0–100.0)
Platelets: 332 10*3/uL (ref 150–400)
RBC: 5.35 MIL/uL (ref 4.22–5.81)
RDW: 13.4 % (ref 11.5–15.5)
WBC: 10.5 10*3/uL (ref 4.0–10.5)
nRBC: 0 % (ref 0.0–0.2)

## 2021-04-25 LAB — BASIC METABOLIC PANEL
Anion gap: 8 (ref 5–15)
BUN: 21 mg/dL (ref 8–23)
CO2: 25 mmol/L (ref 22–32)
Calcium: 8.9 mg/dL (ref 8.9–10.3)
Chloride: 104 mmol/L (ref 98–111)
Creatinine, Ser: 1.21 mg/dL (ref 0.61–1.24)
GFR, Estimated: 59 mL/min — ABNORMAL LOW (ref >60)
Glucose, Bld: 130 mg/dL — ABNORMAL HIGH (ref 70–99)
Potassium: 4.2 mmol/L (ref 3.5–5.1)
Sodium: 137 mmol/L (ref 135–145)

## 2021-04-25 LAB — GLUCOSE, CAPILLARY
Glucose-Capillary: 165 mg/dL — ABNORMAL HIGH (ref 70–99)
Glucose-Capillary: 121 mg/dL — ABNORMAL HIGH (ref 70–99)
Glucose-Capillary: 128 mg/dL — ABNORMAL HIGH (ref 70–99)
Glucose-Capillary: 163 mg/dL — ABNORMAL HIGH (ref 70–99)
Glucose-Capillary: 164 mg/dL — ABNORMAL HIGH (ref 70–99)

## 2021-04-25 LAB — C-REACTIVE PROTEIN: CRP: 3.9 mg/dL — ABNORMAL HIGH (ref <1.0)

## 2021-04-25 MED ORDER — ASPIRIN EC 81 MG PO TBEC
81.0000 mg | DELAYED_RELEASE_TABLET | Freq: Every day | ORAL | Status: DC
  Administered 2021-04-26 – 2021-04-28 (×3): 81 mg via ORAL
  Filled 2021-04-25 (×3): qty 1

## 2021-04-25 NOTE — Plan of Care (Signed)

## 2021-04-25 NOTE — Progress Notes (Addendum)
PROGRESS NOTE    Bobby Rojas  ZOX:[id number] DOB: 1933/10/02 DOA: 04/23/2021 PCP: Jolene Provost, MD   Brief Narrative: 87 year old with past medical history significant for deafness, CAD, diabetes type 2, hypertension, hyperlipidemia, dementia.  Per HPI daughter reporter patient has advancing dementia.  Only person who can communicate with him routinely is his son-in-law who has to be present in person.  Patient used to do okay with ASL interpreter over a monitor but now does not really do well with video interpreters anymore.  Now days answer most questions even to son-in-law as yes and no.  Presented from a skilled nursing facility after he was noticed to have left arm weakness and left-sided facial droop.  Report that the patient had left arm tingling as well.  Work-up in the ED: MRI negative for acute stroke, CTA does show high-grade right ICA origin stenosis.  Received Ativan for MRI.  Patient was found to have to be positive for COVID.    Assessment & Plan:   Principal Problem:   TIA (transient ischemic attack) Active Problems:   COVID-19 virus infection   HTN (hypertension)   HLD (hyperlipidemia)   DM2 (diabetes mellitus, type 2) (HCC)   Acute metabolic encephalopathy   1-TIA: Patient noticed to have left facial weakness, left arm weakness. MRI negative for acute stroke. CTA: Showed high-grade stenosis of the right ICA at its origin with quantification limited by heavily calcified nature of the plaque. Neurology recommended vascular surgery evaluation. Patient was a started on Plavix. Stable today, he was able to moves all 4 extremities.   2-Acute metabolic encephalopathy, on top of underlying dementia: Patient is sleepy and lethargic this morning could be related to Ativan. Component of delirium secondary to COVID More alert today. He was able to interact some with sign language with speech.   3-PNA COVID-19: Acute Hypoxic respiratory Failure;  -Desat last night to 83  RA (8/08), he required suction.  -Continue with Remdesivir -Hypoxic overnight required NB mask 15 L. Repeated chest x ray; Stable basilar atelectasis, left pleural effusion. He was suction with good respond.  -Started on IV Dexamethasone.  -He has been able to be wean down to 5 L Mier.   High-grade stenosis of the right ICA: Vascular consulted. They will review doppler.  Symptomatic.   DM type 2 Hold home medications. Continue  sliding scale insulin  HTN; holding Norvasc and lisinopril BP normal./   HLD; continue with the statins Hypothyroidism: Continue with Synthroid      Estimated body mass index is 28.11 kg/m as calculated from the following:   Height as of this encounter: 6' (1.829 m).   Weight as of this encounter: 94 kg.   DVT prophylaxis: Lovenox Code Status: DNR.  Discussed with daughter in law who discussed with patient's son, patient is DNR>  Family Communication: Daughter over phone 9/8 Disposition Plan:  Status is: Observation  The patient remains OBS appropriate and will d/c before 2 midnights.  Dispo: The patient is from: ALF plus memory care unit.               Anticipated d/c is to: SNF              Patient currently is not medically stable to d/c.   Difficult to place patient No        Consultants:  Neurology Vascular surgery   Procedures:    Antimicrobials:    Subjective: He is alert today, following some command, he was able to  raise both arm and moves legs.  He has productive cough, he is managing his secretions better. .  I try to use sing language interpreter through I pad, patient was not able to cooperate. I have requested nurse to get sign language interpreter in person.  Objective: Vitals:   04/24/21 2316 04/24/21 2336 04/25/21 0350 04/25/21 0751  BP:  130/71 137/74 (!) 111/93  Pulse: 90 86 73 67  Resp: 20 (!) 23 (!) 22 19  Temp:  99.3 F (37.4 C) (!) 97.4 F (36.3 C) 97.8 F (36.6 C)  TempSrc:  Axillary Axillary  Axillary  SpO2: 98% 97% 97% 98%  Weight:      Height:        Intake/Output Summary (Last 24 hours) at 04/25/2021 1234 Last data filed at 04/25/2021 0653 Gross per 24 hour  Intake --  Output 150 ml  Net -150 ml    Filed Weights   04/23/21 1600 04/23/21 1737  Weight: 94 kg 94 kg    Examination:  General exam: NAD Respiratory system: no wheezing, no crackles.  Cardiovascular system:  S 1, S 2 RRR Gastrointestinal system: BS present, soft, nt Central nervous system: he is alert today, following commands,  Extremities: Symmetric 5 x 5 power.   Data Reviewed: I have personally reviewed following labs and imaging studies  CBC: Recent Labs  Lab 04/23/21 1649 04/23/21 1659 04/25/21 0612  WBC 8.1  --  10.5  NEUTROABS 6.1  --   --   HGB 16.3 16.3 16.3  HCT 49.8 48.0 49.2  MCV 94.5  --  92.0  PLT 309  --  332    Basic Metabolic Panel: Recent Labs  Lab 04/23/21 1649 04/23/21 1659 04/25/21 0612  NA 138 140 137  K 4.7 4.3 4.2  CL 105 104 104  CO2 26  --  25  GLUCOSE 132* 128* 130*  BUN 22 26* 21  CREATININE 1.32* 1.30* 1.21  CALCIUM 8.8*  --  8.9    GFR: Estimated Creatinine Clearance: 53.2 mL/min (by C-G formula based on SCr of 1.21 mg/dL). Liver Function Tests: Recent Labs  Lab 04/23/21 1649  AST 34  ALT 18  ALKPHOS 51  BILITOT 1.3*  PROT 6.1*  ALBUMIN 3.6    No results for input(s): LIPASE, AMYLASE in the last 168 hours. No results for input(s): AMMONIA in the last 168 hours. Coagulation Profile: Recent Labs  Lab 04/23/21 1649  INR 1.2    Cardiac Enzymes: No results for input(s): CKTOTAL, CKMB, CKMBINDEX, TROPONINI in the last 168 hours. BNP (last 3 results) No results for input(s): PROBNP in the last 8760 hours. HbA1C: Recent Labs    04/24/21 0434  HGBA1C 6.0*    CBG: Recent Labs  Lab 04/24/21 1615 04/24/21 2322 04/25/21 0301 04/25/21 0823 04/25/21 1221  GLUCAP 90 164* 165* 121* 128*    Lipid Profile: Recent Labs     04/24/21 0436  CHOL 122  HDL 42  LDLCALC 68  TRIG 61  CHOLHDL 2.9    Thyroid Function Tests: No results for input(s): TSH, T4TOTAL, FREET4, T3FREE, THYROIDAB in the last 72 hours. Anemia Panel: No results for input(s): VITAMINB12, FOLATE, FERRITIN, TIBC, IRON, RETICCTPCT in the last 72 hours. Sepsis Labs: No results for input(s): PROCALCITON, LATICACIDVEN in the last 168 hours.  Recent Results (from the past 240 hour(s))  Resp Panel by RT-PCR (Flu A&B, Covid) Nasopharyngeal Swab     Status: Abnormal   Collection Time: 04/23/21  6:31 PM  Specimen: Nasopharyngeal Swab; Nasopharyngeal(NP) swabs in vial transport medium  Result Value Ref Range Status   SARS Coronavirus 2 by RT PCR POSITIVE (A) NEGATIVE Final    Comment: RESULT CALLED TO, READ BACK BY AND VERIFIED WITH: DACIA HARRIS 04/23/2021 @2036  BY JW (NOTE) SARS-CoV-2 target nucleic acids are DETECTED.  The SARS-CoV-2 RNA is generally detectable in upper respiratory specimens during the acute phase of infection. Positive results are indicative of the presence of the identified virus, but do not rule out bacterial infection or co-infection with other pathogens not detected by the test. Clinical correlation with patient history and other diagnostic information is necessary to determine patient infection status. The expected result is Negative.  Fact Sheet for Patients: BloggerCourse.comhttps://www.fda.gov/media/152166/download  Fact Sheet for Healthcare Providers: SeriousBroker.ithttps://www.fda.gov/media/152162/download  This test is not yet approved or cleared by the [place] FDA and  has been authorized for detection and/or diagnosis of SARS-CoV-2 by FDA under an Emergency Use Authorization (EUA).  This EUA will remain in effect (meaning this test can be  used) for the duration of  the COVID-19 declaration under Section 564(b)(1) of the Act, 21 U.S.C. section 360bbb-3(b)(1), unless the authorization is terminated or revoked sooner.      Influenza A by PCR NEGATIVE NEGATIVE Final   Influenza B by PCR NEGATIVE NEGATIVE Final    Comment: (NOTE) The Xpert Xpress SARS-CoV-2/FLU/RSV plus assay is intended as an aid in the diagnosis of influenza from Nasopharyngeal swab specimens and should not be used as a sole basis for treatment. Nasal washings and aspirates are unacceptable for Xpert Xpress SARS-CoV-2/FLU/RSV testing.  Fact Sheet for Patients: BloggerCourse.comhttps://www.fda.gov/media/152166/download  Fact Sheet for Healthcare Providers: SeriousBroker.ithttps://www.fda.gov/media/152162/download  This test is not yet approved or cleared by the [place] FDA and has been authorized for detection and/or diagnosis of SARS-CoV-2 by FDA under an Emergency Use Authorization (EUA). This EUA will remain in effect (meaning this test can be used) for the duration of the COVID-19 declaration under Section 564(b)(1) of the Act, 21 U.S.C. section 360bbb-3(b)(1), unless the authorization is terminated or revoked.  Performed at [facility], [address] [place], [place] [id number]           Radiology Studies: MR ANGIO HEAD WO CONTRAST  Result Date: 04/23/2021 CLINICAL DATA:  Stroke, follow up. Left-sided weakness and facial droop. EXAM: MRI HEAD WITHOUT CONTRAST MRA HEAD WITHOUT CONTRAST TECHNIQUE: Multiplanar, multi-echo pulse sequences of the brain and surrounding structures were acquired without intravenous contrast. Angiographic images of the Circle of Willis were acquired using MRA technique without intravenous contrast. COMPARISON:  Head CT and CTA 04/23/2021.  Head MRI 10/20/2017. FINDINGS: MRI HEAD FINDINGS The study is intermittently up to moderately motion degraded. Brain: There is no evidence of an acute infarct, intracranial hemorrhage, mass, midline shift, or extra-axial fluid collection. Small chronic infarcts are again noted in the right frontal lobe and right cerebellar hemisphere. T2 hyperintensities elsewhere in the cerebral white  matter bilaterally have mildly progressed from the prior MRI and are nonspecific but compatible with extensive chronic small vessel ischemic disease. There is moderate cerebral atrophy. Vascular: Major intracranial vascular flow voids are preserved. Skull and upper cervical spine: Unremarkable bone marrow signal. Sinuses/Orbits: Bilateral cataract extraction. Mild mucosal thickening in the paranasal sinuses. Small left mastoid effusion. Other: None. MRA HEAD FINDINGS The study is moderately motion degraded despite repeat imaging. Anterior circulation: Internal carotid arteries are patent from skull base to carotid termini with motion artifact limiting assessment for stenosis though with only  mild right-sided cavernous stenosis demonstrated on today's CTA. The right A1 segment is absent. The ACAs and MCAs are patent without a gross proximal branch occlusion or high-grade proximal stenosis although assessment is limited by motion artifact. 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 Posterior circulation: Only the distal most V4 segments were included and are patent with the left being dominant. The basilar artery is widely patent. There is a patent left posterior communicating artery. Both PCAs are patent without evidence of a significant proximal stenosis. Short-segment signal loss involving the proximal left P2 segment on the second acquisition is artifactual. No aneurysm is identified. Anatomic variants: Absent right A1. IMPRESSION: 1. No acute intracranial abnormality. 2. Extensive chronic small vessel ischemic disease. 3. Motion degraded head MRA without large vessel occlusion. Electronically Signed   By: Sebastian Ache M.D.   On: 04/23/2021 21:02   MR BRAIN WO CONTRAST  Result Date: 04/23/2021 CLINICAL DATA:  Stroke, follow up. Left-sided weakness and facial droop. EXAM: MRI HEAD WITHOUT CONTRAST MRA HEAD WITHOUT CONTRAST TECHNIQUE: Multiplanar, multi-echo pulse sequences of  the brain and surrounding structures were acquired without intravenous contrast. Angiographic images of the Circle of Willis were acquired using MRA technique without intravenous contrast. COMPARISON:  Head CT and CTA 04/23/2021.  Head MRI 10/20/2017. FINDINGS: MRI HEAD FINDINGS The study is intermittently up to moderately motion degraded. Brain: There is no evidence of an acute infarct, intracranial hemorrhage, mass, midline shift, or extra-axial fluid collection. Small chronic infarcts are again noted in the right frontal lobe and right cerebellar hemisphere. T2 hyperintensities elsewhere in the cerebral white matter bilaterally have mildly progressed from the prior MRI and are nonspecific but compatible with extensive chronic small vessel ischemic disease. There is moderate cerebral atrophy. Vascular: Major intracranial vascular flow voids are preserved. Skull and upper cervical spine: Unremarkable bone marrow signal. Sinuses/Orbits: Bilateral cataract extraction. Mild mucosal thickening in the paranasal sinuses. Small left mastoid effusion. Other: None. MRA HEAD FINDINGS The study is moderately motion degraded despite repeat imaging. Anterior circulation: Internal carotid arteries are patent from skull base to carotid termini with motion artifact limiting assessment for stenosis though with only mild right-sided cavernous stenosis demonstrated on today's CTA. The right A1 segment is absent. The ACAs and MCAs are patent without a gross proximal branch occlusion or high-grade proximal stenosis although assessment is limited by motion artifact. 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 Posterior circulation: Only the distal most V4 segments were included and are patent with the left being dominant. The basilar artery is widely patent. There is a patent left posterior communicating artery. Both PCAs are patent without evidence of a significant proximal stenosis. Short-segment  signal loss involving the proximal left P2 segment on the second acquisition is artifactual. No aneurysm is identified. Anatomic variants: Absent right A1. IMPRESSION: 1. No acute intracranial abnormality. 2. Extensive chronic small vessel ischemic disease. 3. Motion degraded head MRA without large vessel occlusion. Electronically Signed   By: Sebastian Ache M.D.   On: 04/23/2021 21:02   DG CHEST PORT 1 VIEW  Result Date: 04/25/2021 CLINICAL DATA:  Hypoxia, dyspnea EXAM: PORTABLE CHEST 1 VIEW COMPARISON:  04/23/2021 FINDINGS: Mild left basilar atelectasis. Small left pleural effusion again noted. Lungs are otherwise clear. No pneumothorax. No pleural effusion on the right. Cardiac size within normal limits. Pulmonary vascularity is normal. IMPRESSION: Stable left basilar atelectasis and small left pleural effusion. Electronically Signed   By: Gloris Ham  Ramiro Harvest M.D.   On: 04/25/2021 00:21   DG CHEST PORT 1 VIEW  Result Date: 04/23/2021 CLINICAL DATA:  COVID. EXAM: PORTABLE CHEST 1 VIEW COMPARISON:  Chest x-ray 02/12/2021. FINDINGS: The aorta is tortuous with atherosclerotic calcifications. The heart is enlarged, unchanged. There are minimal strandy opacities in the left lung base. There is no pleural effusion or pneumothorax identified. No acute fractures are seen. IMPRESSION: 1. Minimal left basilar atelectasis. 2. Stable cardiomegaly. 3.  Aortic Atherosclerosis (ICD10-I70.0). Electronically Signed   By: Darliss Cheney M.D.   On: 04/23/2021 21:30   ECHOCARDIOGRAM COMPLETE  Result Date: 04/24/2021    ECHOCARDIOGRAM REPORT   Patient Name:   Bobby Rojas Date of Exam: 04/24/2021 Medical Rec #:  [id number]  Height:       72.0 in Accession #:    [id number] Weight:       207.2 lb Date of Birth:  1934/06/26  BSA:          2.163 m Patient Age:    87 years   BP:           158/79 mmHg Patient Gender: M          HR:           76 bpm. Exam Location:  Inpatient Procedure: 2D Echo, Color Doppler, Cardiac Doppler, 3D Echo and  Intracardiac            Opacification Agent Indications:    Stroke I63.9  History:        Patient has prior history of Echocardiogram examinations, most                 recent 10/16/2017. Signs/Symptoms:dementia; Risk                 Factors:Hypertension, Diabetes and Dyslipidemia.  Sonographer:    [name] RDCS Referring Phys: [phone] JARED M GARDNER IMPRESSIONS  1. Left ventricular ejection fraction, by estimation, is 60 to 65%. The left ventricle has normal function. The left ventricle has no regional wall motion abnormalities. There is severe asymmetric left ventricular hypertrophy of the septal segment. Left  ventricular diastolic parameters are consistent with Grade I diastolic dysfunction (impaired relaxation).  2. Right ventricular systolic function is normal. The right ventricular size is normal. There is normal pulmonary artery systolic pressure. The estimated right ventricular systolic pressure is 31.1 mmHg.  3. The mitral valve is degenerative. Trivial mitral valve regurgitation. No evidence of mitral stenosis.  4. The aortic valve is calcified. There is severe calcifcation of the aortic valve. There is severe thickening of the aortic valve. Aortic valve regurgitation is moderate. PHT of AI . Aortic valve mean gradient measures 18.0 mmHg. Aortic valve Vmax  measures 2.78 m/s. Although the mean AVG and VMax are consistent with mild AS, the DVI is low at 0.25, calculated AVA 0.79cm2 and SVI are low which likely represents low flow low gradient aortic stenosis with HFpEF of at least moderate degree but possibly moderate to severe.  5. Aortic dilatation noted. There is mild dilatation of the aortic root, measuring 38 mm.  6. The inferior vena cava is normal in size with greater than 50% respiratory variability, suggesting right atrial pressure of 3 mmHg.  7. Consider TEE for further evaluation of aortic valve. FINDINGS  Left Ventricle: Left ventricular ejection fraction, by estimation, is 60 to 65%.  The left ventricle has normal function. The left ventricle has no regional wall motion abnormalities. Definity contrast agent was given IV to delineate the  left ventricular  endocardial borders. The left ventricular internal cavity size was normal in size. There is severe asymmetric left ventricular hypertrophy of the septal segment. Left ventricular diastolic parameters are consistent with Grade I diastolic dysfunction (impaired relaxation). Normal left ventricular filling pressure. Right Ventricle: The right ventricular size is normal. No increase in right ventricular wall thickness. Right ventricular systolic function is normal. There is normal pulmonary artery systolic pressure. The tricuspid regurgitant velocity is 2.65 m/s, and  with an assumed right atrial pressure of 3 mmHg, the estimated right ventricular systolic pressure is 31.1 mmHg. Left Atrium: Left atrial size was normal in size. Right Atrium: Right atrial size was normal in size. Pericardium: There is no evidence of pericardial effusion. Mitral Valve: The mitral valve is degenerative in appearance. There is mild calcification of the anterior mitral valve leaflet(s). Trivial mitral valve regurgitation. No evidence of mitral valve stenosis. Tricuspid Valve: The tricuspid valve is normal in structure. Tricuspid valve regurgitation is trivial. No evidence of tricuspid stenosis. Aortic Valve: The aortic valve is calcified. There is severe calcifcation of the aortic valve. There is severe thickening of the aortic valve. Aortic valve regurgitation is moderate. Aortic regurgitation PHT measures 519 msec. Moderate aortic stenosis is  present. Aortic valve mean gradient measures 18.0 mmHg. Aortic valve peak gradient measures 30.9 mmHg. Aortic valve area, by VTI measures 0.79 cm. Pulmonic Valve: The pulmonic valve was normal in structure. Pulmonic valve regurgitation is mild. No evidence of pulmonic stenosis. Aorta: Aortic dilatation noted. There is mild  dilatation of the aortic root, measuring 38 mm. Venous: The inferior vena cava is normal in size with greater than 50% respiratory variability, suggesting right atrial pressure of 3 mmHg. IAS/Shunts: No atrial level shunt detected by color flow Doppler.  LEFT VENTRICLE PLAX 2D LVIDd:         4.10 cm  Diastology LVIDs:         2.90 cm  LV e' medial:    5.31 cm/s LV PW:         1.10 cm  LV E/e' medial:  8.6 LV IVS:        1.80 cm  LV e' lateral:   3.73 cm/s LVOT diam:     2.00 cm  LV E/e' lateral: 12.3 LV SV:         42 LV SV Index:   20 LVOT Area:     3.14 cm  RIGHT VENTRICLE RV S prime:     14.60 cm/s TAPSE (M-mode): 2.4 cm LEFT ATRIUM             Index       RIGHT ATRIUM           Index LA diam:        3.50 cm 1.62 cm/m  RA Area:     16.10 cm LA Vol (A2C):   50.1 ml 23.16 ml/m RA Volume:   40.90 ml  18.91 ml/m LA Vol (A4C):   59.9 ml 27.69 ml/m LA Biplane Vol: 56.0 ml 25.89 ml/m  AORTIC VALVE                    PULMONIC VALVE AV Area (Vmax):    0.75 cm     PR End Diast Vel: 8.18 msec AV Area (Vmean):   0.70 cm AV Area (VTI):     0.79 cm AV Vmax:           278.00 cm/s AV Vmean:  209.000 cm/s AV VTI:            0.535 m AV Peak Grad:      30.9 mmHg AV Mean Grad:      18.0 mmHg LVOT Vmax:         66.60 cm/s LVOT Vmean:        46.400 cm/s LVOT VTI:          0.135 m LVOT/AV VTI ratio: 0.25 AI PHT:            519 msec  AORTA Ao Root diam: 3.80 cm Ao Asc diam:  3.60 cm MITRAL VALVE               TRICUSPID VALVE MV Area (PHT): 3.16 cm    TR Peak grad:   28.1 mmHg MV Decel Time: 240 msec    TR Vmax:        265.00 cm/s MV E velocity: 45.85 cm/s MV A velocity: 61.50 cm/s  SHUNTS MV E/A ratio:  0.75        Systemic VTI:  0.14 m                            Systemic Diam: 2.00 cm Armanda Magic MD Electronically signed by Armanda Magic MD Signature Date/Time: 04/24/2021/11:02:03 AM    Final    CT HEAD CODE STROKE WO CONTRAST  Result Date: 04/23/2021 CLINICAL DATA:  Code stroke. Neuro deficit, acute, stroke  suspected. Left facial droop and left arm tingling. EXAM: CT HEAD WITHOUT CONTRAST TECHNIQUE: Contiguous axial images were obtained from the base of the skull through the vertex without intravenous contrast. COMPARISON:  07/10/2020 FINDINGS: Brain: There is no evidence of an acute infarct, intracranial hemorrhage, mass, midline shift, or extra-axial fluid collection. There is mild-to-moderate cerebral atrophy. Patchy and confluent hypodensities in the cerebral white matter bilaterally are unchanged and nonspecific but compatible with extensive chronic small vessel ischemic disease. Small chronic infarcts are again noted in the right frontal operculum and cerebellum. Vascular: Calcified atherosclerosis at the skull base. No hyperdense vessel. Skull: No fracture or suspicious osseous lesion. Sinuses/Orbits: Mild mucosal thickening in the paranasal sinuses. Clear mastoid air cells. Bilateral cataract extraction. Other: None. ASPECTS [facility] Stroke Program Early CT Score) - Ganglionic level infarction (caudate, lentiform nuclei, internal capsule, insula, M1-M3 cortex): 7 - Supraganglionic infarction (M4-M6 cortex): 3 Total score (0-10 with 10 being normal): 10 IMPRESSION: 1. No evidence of acute intracranial abnormality.  ASPECTS of 10. 2. Extensive chronic small vessel ischemic disease. 3. 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 These results were communicated to Dr. Otelia Limes at 5:16 pm on 04/23/2021 by text page via the [facility] messaging system. Electronically Signed   By: Sebastian Ache M.D.   On: 04/23/2021 17:16   VAS US CAROTID (at [facility] and [facility] only)  Result Date: 04/24/2021 Carotid Arterial Duplex Study Patient Name:  Bobby Rojas  Date of Exam:   04/24/2021 Medical Rec #: [id number]   Accession #:    [id number] Date of Birth: 12/02/1933   Patient Gender: M Patient Age:   64 years Exam Location:  [facility] Procedure:      VAS US CAROTID Referring Phys: Lyda Perone  --------------------------------------------------------------------------------  Indications:      CVA. Risk Factors:     Hypertension, hyperlipidemia, Diabetes, coronary artery                   disease. Comparison Study: no prior  Performing Technologist: Argentina Rojas RVS  Examination Guidelines: A complete evaluation includes B-mode imaging, spectral Doppler, color Doppler, and power Doppler as needed of all accessible portions of each vessel. Bilateral testing is considered an integral part of a complete examination. Limited examinations for reoccurring indications may be performed as noted.  Right Carotid Findings: +----------+--------+--------+--------+--------------------+-------------------+           PSV cm/sEDV cm/sStenosisPlaque Description  Comments            +----------+--------+--------+--------+--------------------+-------------------+ CCA Prox  65      10              heterogenous                            +----------+--------+--------+--------+--------------------+-------------------+ CCA Distal65      9               heterogenous and                                                          calcific                                +----------+--------+--------+--------+--------------------+-------------------+ ICA Prox  92      16      1-39%   heterogenous        Velocities may                                                            underestimate                                                             degree of stenosis                                                        due to more                                                               proximal                                                                  obstruction.        +----------+--------+--------+--------+--------------------+-------------------+ ICA Distal55  17                                                       +----------+--------+--------+--------+--------------------+-------------------+ ECA       62      7                                                       +----------+--------+--------+--------+--------------------+-------------------+ +----------+--------+-------+--------+-------------------+           PSV cm/sEDV cmsDescribeArm Pressure (mmHG) +----------+--------+-------+--------+-------------------+ [id number]                                         +----------+--------+-------+--------+-------------------+ +---------+--------+--+--------+--+---------+ VertebralPSV cm/s32EDV cm/s10Antegrade +---------+--------+--+--------+--+---------+  Left Carotid Findings: +----------+--------+--------+--------+------------------+--------+           PSV cm/sEDV cm/sStenosisPlaque DescriptionComments +----------+--------+--------+--------+------------------+--------+ CCA Prox  80                      heterogenous               +----------+--------+--------+--------+------------------+--------+ CCA Distal59      13              heterogenous               +----------+--------+--------+--------+------------------+--------+ ICA Prox  58      16      1-39%   heterogenous               +----------+--------+--------+--------+------------------+--------+ ICA Distal65      19                                         +----------+--------+--------+--------+------------------+--------+ ECA       66      11                                         +----------+--------+--------+--------+------------------+--------+ +----------+--------+--------+--------+-------------------+           PSV cm/sEDV cm/sDescribeArm Pressure (mmHG) +----------+--------+--------+--------+-------------------+ Subclavian105                                         +----------+--------+--------+--------+-------------------+ +---------+--------+---+--------+--+---------+ VertebralPSV  cm/s101EDV cm/s18Antegrade +---------+--------+---+--------+--+---------+   Summary: Right Carotid: Velocities in the right ICA are consistent with a 1-39% stenosis. Left Carotid: Velocities in the left ICA are consistent with a 1-39% stenosis. Vertebrals: Bilateral vertebral arteries demonstrate antegrade flow. *See table(s) above for measurements and observations.     Preliminary    CT ANGIO HEAD NECK W WO CM W PERF (CODE STROKE)  Result Date: 04/23/2021 CLINICAL DATA:  Neuro deficit, acute, stroke suspected. Left facial droop and left arm tingling. EXAM: CT ANGIOGRAPHY HEAD AND NECK CT PERFUSION BRAIN TECHNIQUE: Multidetector CT imaging of the head and neck was performed using the standard protocol during bolus administration of intravenous  contrast. Multiplanar CT image reconstructions and MIPs were obtained to evaluate the vascular anatomy. Carotid stenosis measurements (when applicable) are obtained utilizing NASCET criteria, using the distal internal carotid diameter as the denominator. Multiphase CT imaging of the brain was performed following IV bolus contrast injection. Subsequent parametric perfusion maps were calculated using RAPID software. CONTRAST:  OMNIPAQUE IOHEXOL 350 MG/ML SOLN COMPARISON:  None. FINDINGS: CTA NECK FINDINGS Aortic arch: Standard 3 vessel aortic arch with a moderate amount of calcified plaque. Proximally 50% stenosis of the origin of the brachiocephalic artery. Right carotid system: Patent with heavily calcified plaque at the carotid bifurcation resulting in high-grade stenosis of the origin of the ICA with quantification limited as the densely calcified plaque obscures the small residual patent lumen. Left carotid system: Patent with a moderate amount of calcified plaque at the carotid bifurcation. No evidence of a significant stenosis or dissection. Vertebral arteries: The left vertebral artery is patent and strongly dominant without evidence of a significant  stenosis or dissection. The right vertebral artery is patent and small with assessment of the V1 segment limited by venous contrast. There is calcified plaque at the origin of the right vertebral artery with assessment of a suspected associated stenosis limited due to poor visualization of the lumen due to the small size of the artery and density of the calcified plaque. Skeleton: Focally advanced disc degeneration at C5-6. Mild cervical facet arthrosis. Other neck: No evidence of cervical lymphadenopathy or mass. Upper chest: No apical lung consolidation or mass. Review of the MIP images confirms the above findings CTA HEAD FINDINGS Anterior circulation: The internal carotid arteries are patent from skull base to carotid termini with calcified plaque resulting in mild cavernous stenosis on the right. ACAs and MCAs are patent without evidence of a proximal branch occlusion or significant proximal stenosis. There is a 2 mm aneurysm projecting laterally from the left MCA bifurcation. Posterior circulation: The intracranial vertebral arteries are patent to the basilar with calcified plaque resulting in moderate left V4 stenosis. The right V4 segment is hypoplastic and diffusely irregular. The basilar artery is widely patent. There is likely a small left posterior communicating artery. Both PCAs are patent without evidence of a significant proximal stenosis. No aneurysm is identified. Venous sinuses: As permitted by contrast timing, patent. Anatomic variants: Absent right A1 segment. Review of the MIP images confirms the above findings CT Brain Perfusion Findings: The study is motion degraded. ASPECTS: 10 CBF (<30%) Volume: 0 mL Perfusion (Tmax>6.0s) volume: 14 mL Mismatch Volume: 14 mL Infarction Location: No core infarct is identified by rapid processing. The small areas of prolonged Tmax are located in the right aspect of the posterior fossa, right occipital region, and high right frontal region and may be  artifactual given involvement of multiple vascular territories and motion. IMPRESSION: 1. No emergent large vessel occlusion. 2. 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 3. Mild intracranial atherosclerosis including mild right cavernous ICA and left V4 stenoses. 4. 2 mm left MCA bifurcation aneurysm. 5. Motion degraded CTP without evidence of an acute core infarct. Small foci of possible penumbra may be artifactual. 6.  Aortic Atherosclerosis (ICD10-I70.0). Emergent results were communicated to Dr. Otelia Limes at 5:39 pm on 04/23/2021 by text page via the [facility] messaging system. Electronically Signed   By: Sebastian Ache M.D.   On: 04/23/2021 17:54        Scheduled Meds:  aspirin  300 mg  Rectal Daily   Or   aspirin EC  325 mg Oral Daily   atorvastatin  40 mg Oral QHS   clopidogrel  300 mg Oral Once   Followed by   clopidogrel  75 mg Oral Daily   dexamethasone (DECADRON) injection  6 mg Intravenous Q24H   divalproex  375 mg Oral BID   enoxaparin (LOVENOX) injection  40 mg Subcutaneous Q24H   insulin aspart  0-9 Units Subcutaneous Q4H   levothyroxine  75 mcg Oral QAC breakfast   Continuous Infusions:  sodium chloride 250 mL (04/24/21 1013)   remdesivir 100 mg in NS 100 mL 100 mg (04/25/21 0946)     LOS: 0 days    Time spent: 35 minutes    Lavanya Roa A Gordan Grell, MD [facility]   If 7PM-7AM, please contact night-coverage [email or web address]  04/25/2021, 12:34 PM

## 2021-04-25 NOTE — TOC CAGE-AID Note (Signed)
Transition of Care [facility]) - CAGE-AID Screening   Patient Details  Name: Brix Brearley MRN: [id number] Date of Birth: 1933-12-18  [facility]) CM/SW Contact:    Erin Sons, LCSW Phone Number: 04/25/2021, 8:57 AM   Clinical Narrative:  Pt unable to participate in CAGE-AID.  CAGE-AID Screening: Substance Abuse Screening unable to be completed due to: : Patient unable to participate             Substance Abuse Education Offered: No

## 2021-04-25 NOTE — Evaluation (Signed)
Clinical/Bedside Swallow Evaluation Patient Details  Name: Bobby Rojas MRN: [id number] Date of Birth: April 26, 1934  Today's Date: 04/25/2021 Time: SLP Start Time (ACUTE ONLY): 8527 SLP Stop Time (ACUTE ONLY): 1006 SLP Time Calculation (min) (ACUTE ONLY): 29 min  Past Medical History:  Past Medical History:  Diagnosis Date   Atherosclerosis    CAD (coronary artery disease)    Deaf    bilateral   Depression    Diabetes mellitus without complication (HCC)    Gastroenteritis    Hypercholesterolemia    Hypertension    Hypothyroid    MCI (mild cognitive impairment)    OSA (obstructive sleep apnea)    Past Surgical History:  Past Surgical History:  Procedure Laterality Date   BLADDER SURGERY     CATARACT EXTRACTION, BILATERAL     ELBOW FRACTURE SURGERY     KNEE SURGERY Right    HPI:  87 y.o. male with medical history significant of Deafness, CAD, DM2, HTN, HLD, dementia.. Per daughter, at baseline: pt with advancing dementia.  Only person who can communicate with him routinely is his son-in-law who has to be present in person.  Pt used to do okay with ASL interpreter over a monitor but now doesn't really do well with video interpreters anymore.  Now a days answers most questions even to son-in-law as yes/no.  Never has done well with lip reading.  Pt sent in from SNF on 04/23/21 after they noticed he had L arm weakness and L sided facial droop.  Report that patient had L arm tingling as well.  Was able to get him to use ASL interpreter on video initially in ED, per interpreter pt also using L hand less than his right; 04/23/21 MRI brain indicated No acute intracranial abnormality.  2. Extensive chronic small vessel ischemic disease; motion degraded; CXR on 04/25/21 indicated sable left basilar atelectasis with small left pleural effusion.  BSE ordered d/t pt failing [facility] swallow screen x2 (coughing with larger volumes).   Assessment / Plan / Recommendation Clinical Impression  Pt seen for clinical  swallowing evaluation via ASL (American Sign Language) to follow simple OME directives (ie: open mouth, stick out tongue) with delayed cough noted with solids and larger amounts of liquids only.  Minimal impaired mastication with solids likely d/t missing dentition.  Pt with timely swallow, but generalized weakness noted in setting of Covid+/recent deconditioning/TIA (MRI indicated no acute abnormality on 04/23/21). Conservative diet of Dysphagia 1/thin via small sips recommended paired with general swallowing precautions with potential for diet upgrade when pt able/strength increases and respiratory effort improved.  Pt advanced from non-rebreather to [place] this am.  Nursing stated no issues with medication administration with puree/crushed presentation.  ST will f/u for diet tolerance and potential objective assessment prn.  Thank you for this consult. SLP Visit Diagnosis: Dysphagia, unspecified (R13.10)    Aspiration Risk  Mild aspiration risk    Diet Recommendation   Dysphagia 1/thin liquids  Medication Administration: Crushed with puree    Other  Recommendations Oral Care Recommendations: Oral care BID   Follow up Recommendations Skilled Nursing facility      Frequency and Duration min 2x/week  1 week       Prognosis Prognosis for Safe Diet Advancement: Good Barriers to Reach Goals: Cognitive deficits      Swallow Study   General Date of Onset: 04/23/21 HPI: 87 y.o. male with medical history significant of Deafness, CAD, DM2, HTN, HLD, dementia..     Per daughter, at baseline:  pt with advancing dementia.  Only person who can communicate with him routinely is his son-in-law who has to be present in person.  Pt used to do okay with ASL interpreter over a monitor but now doesn't really do well with video interpreters anymore.  Now a days answers most questions even to son-in-law as yes/no.  Never has done well with lip reading.     Pt sent in from SNF today after they noticed he had L arm  weakness and L sided facial droop.  Report that patient had L arm tingling as well.     Was able to get him to use ASL interpreter on video initially in ED, per interpreter pt also using L hand less than his right; 04/23/21 MRI brain indicated No acute intracranial abnormality.  2. Extensive chronic small vessel ischemic disease; motion degraded; BSE ordered d/t pt failing [facility] swallow screen. Type of Study: Bedside Swallow Evaluation Previous Swallow Assessment: Failed [facility] swallow screen 9/6 and 9/7 Diet Prior to this Study: NPO Temperature Spikes Noted: Yes Respiratory Status: Nasal cannula;Other (comment) (previously on non-rebreather) History of Recent Intubation: No Behavior/Cognition: Alert;Cooperative;Requires cueing Oral Cavity Assessment: Within Functional Limits Oral Care Completed by SLP: No (limited d/t pt being on non-rebreather/anxious) Oral Cavity - Dentition: Missing dentition Vision: Functional for self-feeding Self-Feeding Abilities: Able to feed self;Needs assist;Needs set up Patient Positioning: Upright in bed Baseline Vocal Quality: Not observed;Other (comment) (Pt communicates via sign) Volitional Cough: Strong;Congested Volitional Swallow: Able to elicit    Oral/Motor/Sensory Function Overall Oral Motor/Sensory Function: Generalized oral weakness Facial Symmetry: Within Functional Limits Lingual Symmetry: Within Functional Limits   Ice Chips Ice chips: Within functional limits Presentation: Spoon   Thin Liquid Thin Liquid: Impaired Presentation: Cup;Straw Pharyngeal  Phase Impairments: Cough - Delayed Other Comments: with larger sips only    Nectar Thick Nectar Thick Liquid: Not tested   Honey Thick Honey Thick Liquid: Not tested   Puree Puree: Within functional limits Presentation: Spoon   Solid     Solid: Impaired Presentation: Spoon Pharyngeal Phase Impairments: Cough - Delayed      Tressie Stalker, M.S., CCC-SLP 04/25/2021,10:35 AM

## 2021-04-25 NOTE — Progress Notes (Addendum)
STROKE TEAM PROGRESS NOTE   INTERVAL HISTORY  [name], a SNF resident at [facility], presented with left sided weakness, tingling, and left facial droop 9/6. These symptoms improved while in CT scan.  He carries past medical history of deafness, CAD, DM2, HTN, HLD, and dementia. His initial NIH was 4 during stroke code. Incidentally found to be COVID+ and he is vaccinated per daughter.   No family is at bedside at time of this exam,  and great difficulty communicating with patient despite tablet at bedside.    Vitals:   04/24/21 2316 04/24/21 2336 04/25/21 0350 04/25/21 0751  BP:  130/71 137/74 (!) 111/93  Pulse: 90 86 73 67  Resp: 20 (!) 23 (!) 22 19  Temp:  99.3 F (37.4 C) (!) 97.4 F (36.3 C) 97.8 F (36.6 C)  TempSrc:  Axillary Axillary Axillary  SpO2: 98% 97% 97% 98%  Weight:      Height:       CBC:  Recent Labs  Lab 04/23/21 1649 04/23/21 1659 04/25/21 0612  WBC 8.1  --  10.5  NEUTROABS 6.1  --   --   HGB 16.3 16.3 16.3  HCT 49.8 48.0 49.2  MCV 94.5  --  92.0  PLT 309  --  332    Basic Metabolic Panel:  Recent Labs  Lab 04/23/21 1649 04/23/21 1659 04/25/21 0612  NA 138 140 137  K 4.7 4.3 4.2  CL 105 104 104  CO2 26  --  25  GLUCOSE 132* 128* 130*  BUN 22 26* 21  CREATININE 1.32* 1.30* 1.21  CALCIUM 8.8*  --  8.9    Lipid Panel:  Recent Labs  Lab 04/24/21 0436  CHOL 122  TRIG 61  HDL 42  CHOLHDL 2.9  VLDL 12  LDLCALC 68    HgbA1c:  Recent Labs  Lab 04/24/21 0434  HGBA1C 6.0*    Urine Drug Screen:  Recent Labs  Lab 04/24/21 0203  LABOPIA NONE DETECTED  COCAINSCRNUR NONE DETECTED  LABBENZ NONE DETECTED  AMPHETMU NONE DETECTED  THCU NONE DETECTED  LABBARB NONE DETECTED     Alcohol Level No results for input(s): ETH in the last 168 hours.  IMAGING past 24 hours DG CHEST PORT 1 VIEW  Result Date: 04/25/2021 CLINICAL DATA:  Hypoxia, dyspnea EXAM: PORTABLE CHEST 1 VIEW COMPARISON:  04/23/2021 FINDINGS: Mild left basilar  atelectasis. Small left pleural effusion again noted. Lungs are otherwise clear. No pneumothorax. No pleural effusion on the right. Cardiac size within normal limits. Pulmonary vascularity is normal. IMPRESSION: Stable left basilar atelectasis and small left pleural effusion. Electronically Signed   By: Helyn Numbers M.D.   On: 04/25/2021 00:21    PHYSICAL EXAM  Temp:  [97.4 F (36.3 C)-100.4 F (38 C)] 97.8 F (36.6 C) (09/08 0751) Pulse Rate:  [67-91] 67 (09/08 0751) Resp:  [18-23] 19 (09/08 0751) BP: (111-166)/(71-93) 111/93 (09/08 0751) SpO2:  [95 %-100 %] 98 % (09/08 0751)  General - Well nourished, well developed, in no apparent distress.  Ophthalmologic - no fundoscopic examination performed.   Cardiovascular - Regular rhythm and rate.  Mental Status -  Level of arousal alert  Unable to assess orientation due to encephalopathy, baseline dementia, language barrier.   Cranial Nerves II - XII - II - Visual field intact OU, blinks to threat bilaterally . III, IV, VI - Extraocular movements intact, tracks across room. V - unable to assess VII - Facial movement intact bilaterally to muscle activation,  mimics to smile  VIII - bilateral deafness , complete  X - unable to assess . XI - unable to assess  XII - unable to assess   Motor Strength - mimics to raise all 4 limbs against gravity, seemingly symmetrically.  Bulk was normal and fasciculations were absent.   Motor Tone - Muscle tone was assessed at the neck and appendages and were normal.  Sensory - unable to test   Coordination - no obvious dysmetria or dyssynergia.  Tremor was absent.  Gait and Station - deferred.   ASSESSMENT/PLAN [name] is a 87 y.o. male with history of dementia (?sensory deprivation secondary to deafness), DM2, HTN, HLD, CAD. He presents with acute onset left arm weakness and left facial droop with subjective complaint of left arm tingling on 9/6 and seen as stroke code. Examination  improved over hour(s) and he appears back to baseline, although assessment is quite limited due to underlying dementia, language barrier, and questionable superimposed encephalopathy.   #TIA: right MCA TIA, likely due to right carotid high-grade stenosis   Transient left arm weakness, left facial droop without radiographic evidence of acute ischemia. There is extensive small vessel disease in periventricular areas especially, chronic right frontal and bilateral cerebellar infarcts, and atrophy of brain imaging.  CTA head & neck right ICA stenosis at origin, but with calcification most predominant feature.  MRI  no acute findings. MRA  incidental left MCA bifurcation aneurysm, seen also on CTA. Absent right A1 Carotid Doppler unremarkable 2D Echo: LVEF 60-65%, no wall motion abnormalities, grade I diastolic dysfunction, degenerative MV, trivial MV regurg, calcified aortic valve, moderate aortic valve regurgitation,  LDL 68 HgbA1c 6.0 VTE prophylaxis - lovenox 40 qd  aspirin 81 mg daily prior to admission, now on ASA 300 PR given no po access and plavix 75mg   Therapy recommendations: SNF Disposition:  SNF   Hypertension Home meds: amlodipine 5mg , lisinopril 40mg  qd Holding both antihypertensives in acute setting  Stable in 140-150s currently  Permissive hypertension (OK if < 220/120) but gradually normalize in 5-7 days Long-term BP goal normotensive, although may require higher normal Bps in setting of carotid stenosis if becomes symptomatic.   Hyperlipidemia Home meds:  atorvastatin 40mg , resumed in hospital LDL 68, goal < 70 High intensity statin above  Continue statin at discharge  Diabetes type II Controlled Home meds:  none  HgbA1c 6.0, goal < 7.0 CBGs SSI  Other Stroke Risk Factors Advanced Age >/= 56  Hx stroke  Coronary artery disease  Other Active Problems Active Sars-CoV2 infection Decadron, remdesivir, and care per primary team  O2 saturation dropping compared to  yesterday Dementia Continue home namenda Vitamin d deficiency Continue supplementation  Restless Leg Syndrome Continue home ropinerole when able  Hypothyroidism Continue home levothyroxine   Hospital day # 0  ATTENDING NOTE: I reviewed above note and agree with the assessment and plan. Pt was seen and examined.   No acute event overnight.  Awake, moving all extremities symmetrically, very difficult exam due to deafness and mute, but able to mimic some movements.  Carotid Doppler yesterday showed no significant carotid stenosis.  Discussed with Dr. , given patient advanced age, deafness, mute and mild dementia, unremarkable carotid Doppler, felt to be best managed by medical management first.  Recommend to continue aspirin 81 and Plavix DAPT for 3 weeks and then Plavix alone.  Continue Lipitor 40.  We will follow-up at [facility] Dr. in 4 weeks.  For detailed assessment and plan, please refer  to above as I have made changes wherever appropriate.   Neurology will sign off. Please call with questions. Pt will follow up with Dr. Marjory Lies at [facility] in about 4 weeks. Thanks for the consult.   Marvel Plan, MD PhD Stroke Neurology 04/25/2021 5:32 PM     To contact Stroke Continuity provider, please refer to [email or web address]. After hours, contact General Neurology

## 2021-04-25 NOTE — Progress Notes (Signed)
   Pt assessed, O2 83% RA, pt noted coughing up thick white/ green mucous, nasal cannula applied, O@ 85%, NON rebreather applied at 15L, O2 90%. Respiratory called and informed. Pt suctioned and copious amount of thick sputum noted. O2 98%. No distress noted.  Other vital signs within normal limits. PRN rectal tylenol given. IV fluids started.    2243 Respiratory called to inform as well.   2335 Provider paged to inform of pts increase in Oxygen requirements. 2243 Provider ordered STAT chest xray.

## 2021-04-25 NOTE — Progress Notes (Signed)
[facility]  Subjective  -no additional events overnight per report   Objective (!) 111/93 67 97.8 F (36.6 C) (Axillary) 19 98%  Intake/Output Summary (Last 24 hours) at 04/25/2021 1317 Last data filed at 04/25/2021 0653 Gross per 24 hour  Intake --  Output 150 ml  Net -150 ml     Laboratory Lab Results: Recent Labs    04/23/21 1649 04/23/21 1659 04/25/21 0612  WBC 8.1  --  10.5  HGB 16.3 16.3 16.3  HCT 49.8 48.0 49.2  PLT 309  --  332   BMET Recent Labs    04/23/21 1649 04/23/21 1659 04/25/21 0612  NA 138 140 137  K 4.7 4.3 4.2  CL 105 104 104  CO2 26  --  25  GLUCOSE 132* 128* 130*  BUN 22 26* 21  CREATININE 1.32* 1.30* 1.21  CALCIUM 8.8*  --  8.9    COAG Lab Results  Component Value Date   INR 1.2 04/23/2021   No results found for: PTT  Assessment/Planning:  87 year old male who is deaf, mute, demented and lives in an assisted living memory care unit that vascular surgery was consulted yesterday with concern for symptomatic right carotid stenosis.  MRI did not reveal any acute event and this was felt to be a TIA with reported left sided weakness.  CTA neck showed heavy calcified lesion at the carotid bifurcation on the right that was hard to quantify and ultimately we recommended a carotid ultrasound/duplex.  Carotid duplex confirms 1 to 39% stenosis on the right.  I have reviewed these images.  I have discussed with Dr. Roda Shutters with neurology.  I do not think the patient would benefit from carotid intervention given his stenosis is less than 50% by velocity criteria on the right.  I would favor medical management.  I have updated his daughter-in-law by phone.  I would recommend aspirin Plavix and statin for maximal medical management.  Discussed that our SVS guidelines would recommend surgical intervention only for greater than 50% stenosis if felt to be symptomatic.  We will need to follow him on an outpatient  basis.  Cephus Shelling 04/25/2021 1:17 PM --

## 2021-04-25 NOTE — Progress Notes (Signed)
87 y.o. male with symptomatic right ICA   CTA: High-grade stenosis of the right ICA at its origin with quantification limited by the heavily calcified nature of the plaque.  Carotid duplex    Right Carotid Findings:  +----------+--------+--------+--------+--------------------+---------------  ----+            PSV cm/sEDV cm/sStenosisPlaque Description  Comments              +----------+--------+--------+--------+--------------------+---------------  ----+  CCA Prox  65      10              heterogenous                              +----------+--------+--------+--------+--------------------+---------------  ----+  CCA Distal65      9               heterogenous and                                                            calcific                                  +----------+--------+--------+--------+--------------------+---------------  ----+  ICA Prox  92      16      1-39%   heterogenous        Velocities may                                                              underestimate                                                                degree of  stenosis                                                         due to more                                                                  proximal  obstruction.          +----------+--------+--------+--------+--------------------+---------------  ----+  ICA Distal55      17                                                        +----------+--------+--------+--------+--------------------+---------------  ----+  ECA       62      7                                                         +----------+--------+--------+--------+--------------------+---------------  ----+   +----------+--------+-------+--------+-------------------+            PSV cm/sEDV  cmsDescribeArm Pressure (mmHG)  +----------+--------+-------+--------+-------------------+  [id number]                                          +----------+--------+-------+--------+-------------------+   +---------+--------+--+--------+--+---------+  VertebralPSV cm/s32EDV cm/s10Antegrade  +---------+--------+--+--------+--+---------+       Left Carotid Findings:  +----------+--------+--------+--------+------------------+--------+            PSV cm/sEDV cm/sStenosisPlaque DescriptionComments  +----------+--------+--------+--------+------------------+--------+  CCA Prox  80                      heterogenous                +----------+--------+--------+--------+------------------+--------+  CCA Distal59      13              heterogenous                +----------+--------+--------+--------+------------------+--------+  ICA Prox  58      16      1-39%   heterogenous                +----------+--------+--------+--------+------------------+--------+  ICA Distal65      19                                          +----------+--------+--------+--------+------------------+--------+  ECA       66      11                                          +----------+--------+--------+--------+------------------+--------+   +----------+--------+--------+--------+-------------------+            PSV cm/sEDV cm/sDescribeArm Pressure (mmHG)  +----------+--------+--------+--------+-------------------+  Subclavian105                                          +----------+--------+--------+--------+-------------------+   +---------+--------+---+--------+--+---------+  VertebralPSV cm/s101EDV cm/s18Antegrade  +---------+--------+---+--------+--+---------+           Summary:  Right Carotid: Velocities in the right ICA are consistent with a 1-39%  stenosis.   Left Carotid: Velocities in the left ICA are consistent with a 1-39%   stenosis.  Vertebrals: Bilateral   Pending plan per Dr. Tonye Becket PA-C

## 2021-04-25 NOTE — Evaluation (Signed)
Occupational Therapy Evaluation Patient Details Name: Bobby Rojas MRN: [id number] DOB: 03-08-1934 Today's Date: 04/25/2021    History of Present Illness 87yo male presenting to [facility] ED on 9/6 from SNF secondary to LUE weakness and L facial droop. Imaging negative for acute findings. Found to be + COVID-19 in ED; tested 9/6. PMH: deafness, HTN, DM, and dementia   Clinical Impression   PTA, pt was living at [facility] memory care and required assistance with ADLs and performed mobility independently; information from chart review and SW who spoke with family. Pt currently requiring Min A for ADLs and Min Guard A for functional mobility. VSS throughout on RA; notified RN staff. Pt would benefit from further acute OT to facilitate safe dc. Recommend return to memory care with supervision and assistance for ADLs.     Follow Up Recommendations  Other (comment) (Return to memory care)    Equipment Recommendations  None recommended by OT    Recommendations for Other Services PT consult     Precautions / Restrictions Precautions Precautions: Fall Precaution Comments: watch SpO2      Mobility Bed Mobility Overal bed mobility: Needs Assistance Bed Mobility: Supine to Sit;Sit to Supine     Supine to sit: Min guard Sit to supine: Min guard   General bed mobility comments: Tacilte cues for sequencing.    Transfers Overall transfer level: Needs assistance Equipment used: None Transfers: Sit to/from [employer] Sit to Stand: Min guard Stand pivot transfers: Min guard       General transfer comment: Min Guard A for safety    Balance Overall balance assessment: Needs assistance Sitting-balance support: No upper extremity supported;Feet supported Sitting balance-Leahy Scale: Fair     Standing balance support: Bilateral upper extremity supported;No upper extremity supported;During functional activity Standing balance-Leahy Scale: Fair                              ADL either performed or assessed with clinical judgement   ADL Overall ADL's : Needs assistance/impaired Eating/Feeding: Set up;Sitting Eating/Feeding Details (indicate cue type and reason): Pt sitting in recliner while eating lunch. Pt agreeable and following cues for transition between each food item. Noting as pt focusing on eating, he would slowly laterally lean to L. Able to correct when leaning was brought ot his awareness with cues. Grooming: Set up;Supervision/safety;Sitting   Upper Body Bathing: Minimal assistance;Sitting   Lower Body Bathing: Minimal assistance;Sit to/from stand   Upper Body Dressing : Minimal assistance;Sitting   Lower Body Dressing: Minimal assistance;Sit to/from stand   Toilet Transfer: Min guard;Stand-pivot (simulated to recliner)           Functional mobility during ADLs: Min guard (very short distance from recliner to bed) General ADL Comments: Pt oresenting with decreased balance and activity tolerance     Vision         Perception     Praxis      Pertinent Vitals/Pain Pain Assessment: No/denies pain     Hand Dominance Right   Extremity/Trunk Assessment Upper Extremity Assessment Upper Extremity Assessment: LUE deficits/detail LUE Deficits / Details: No noted weakness compared to RUE. Slight decrreased use but also not dominant   Lower Extremity Assessment Lower Extremity Assessment: Defer to PT evaluation   Cervical / Trunk Assessment Cervical / Trunk Assessment: Kyphotic   Communication Communication Communication: Other (comment) (OT fluent in ASL)   Cognition Arousal/Alertness: Awake/alert Behavior During Therapy: WFL for tasks assessed/performed (Generally  flat) Overall Cognitive Status: History of cognitive impairments - at baseline                                 General Comments: history of dementia. Very pleasant and agreeable. Requiring increased cues at times. Perseverating on asking  if therapist was married and then tell her his wife's name.   General Comments  All vitals stable throughout session on RA. SpO2 100-97% on RA throughout. Notified RN    Exercises     Shoulder Instructions      Home Living Family/patient expects to be discharged to:: Skilled nursing facility   Available Help at Discharge: Skilled Nursing Facility Type of Home: Skilled Nursing Facility                           Additional Comments: Pt unable to provide information about prior living situation. Per chart, pt from [place] at memory care      Prior Functioning/Environment Level of Independence: Needs assistance        Comments: Per chart review, family reports pt able to ambulate but does not specify if he uses AD. Unsure of PLOF with ADLs but likely needs some level of assistance.        OT Problem List: Decreased strength;Decreased range of motion;Decreased activity tolerance;Impaired balance (sitting and/or standing);Decreased knowledge of use of DME or AE;Decreased knowledge of precautions      OT Treatment/Interventions: Self-care/ADL training;Therapeutic exercise;Energy conservation;DME and/or AE instruction;Therapeutic activities;Patient/family education    OT Goals(Current goals can be found in the care plan section) Acute Rehab OT Goals Patient Stated Goal: Not stated OT Goal Formulation: With patient Time For Goal Achievement: 05/09/21 Potential to Achieve Goals: Good  OT Frequency: Min 3X/week   Barriers to D/C:            Co-evaluation              AM-PAC OT "6 Clicks" Daily Activity     Outcome Measure Help from another person eating meals?: A Little Help from another person taking care of personal grooming?: A Little Help from another person toileting, which includes using toliet, bedpan, or urinal?: A Little Help from another person bathing (including washing, rinsing, drying)?: A Little Help from another person to put on and  taking off regular upper body clothing?: A Little Help from another person to put on and taking off regular lower body clothing?: A Little 6 Click Score: 18   End of Session Nurse Communication: Mobility status  Activity Tolerance: Patient tolerated treatment well Patient left: in bed;with call bell/phone within reach;with bed alarm set  OT Visit Diagnosis: Unsteadiness on feet (R26.81);Other abnormalities of gait and mobility (R26.89);Muscle weakness (generalized) (M62.81)                Time: 4765-4650 OT Time Calculation (min): 48 min Charges:  OT General Charges $OT Visit: 1 Visit OT Evaluation $OT Eval Moderate Complexity: 1 Mod OT Treatments $Self Care/Home Management : 23-37 mins  Mackayla Mullins MSOT, OTR/L Acute Rehab Pager: [phone] Office: [phone]  Theodoro Grist Maragret Vanacker 04/25/2021, 3:54 PM

## 2021-04-25 NOTE — Plan of Care (Signed)
Pt opens eyes, pt is mute and deaf due to this unable to complete admissions questions. Pt had increased secretions, pt suctioned multiple times.   Problem: Education: Goal: Knowledge of General Education information will improve Description: Including pain rating scale, medication(s)/side effects and non-pharmacologic comfort measures Outcome: Progressing   Problem: Clinical Measurements: Goal: Ability to maintain clinical measurements within normal limits will improve Outcome: Progressing Goal: Will remain free from infection Outcome: Progressing Goal: Diagnostic test results will improve Outcome: Progressing Goal: Respiratory complications will improve Outcome: Progressing Goal: Cardiovascular complication will be avoided Outcome: Progressing   Problem: Activity: Goal: Risk for activity intolerance will decrease Outcome: Progressing   Problem: Nutrition: Goal: Adequate nutrition will be maintained Outcome: Progressing   Problem: Elimination: Goal: Will not experience complications related to bowel motility Outcome: Progressing Goal: Will not experience complications related to urinary retention Outcome: Progressing   Problem: Coping: Goal: Level of anxiety will decrease Outcome: Progressing   Problem: Elimination: Goal: Will not experience complications related to bowel motility Outcome: Progressing Goal: Will not experience complications related to urinary retention Outcome: Progressing   Problem: Pain Managment: Goal: General experience of comfort will improve Outcome: Progressing   Problem: Safety: Goal: Ability to remain free from injury will improve Outcome: Progressing   Problem: Skin Integrity: Goal: Risk for impaired skin integrity will decrease Outcome: Progressing

## 2021-04-25 NOTE — Evaluation (Signed)
Speech Language Pathology Evaluation Patient Details Name: Bobby Rojas MRN: [id number] DOB: 04-05-1934 Today's Date: 04/25/2021 Time: 6213-0865 SLP Time Calculation (min) (ACUTE ONLY): 29 min  Problem List:  Patient Active Problem List   Diagnosis Date Noted   TIA (transient ischemic attack) 04/23/2021   COVID-19 virus infection 04/23/2021   HTN (hypertension) 04/23/2021   HLD (hyperlipidemia) 04/23/2021   DM2 (diabetes mellitus, type 2) (HCC) 04/23/2021   Acute metabolic encephalopathy 04/23/2021   Past Medical History:  Past Medical History:  Diagnosis Date   Atherosclerosis    CAD (coronary artery disease)    Deaf    bilateral   Depression    Diabetes mellitus without complication (HCC)    Gastroenteritis    Hypercholesterolemia    Hypertension    Hypothyroid    MCI (mild cognitive impairment)    OSA (obstructive sleep apnea)    Past Surgical History:  Past Surgical History:  Procedure Laterality Date   BLADDER SURGERY     CATARACT EXTRACTION, BILATERAL     ELBOW FRACTURE SURGERY     KNEE SURGERY Right    HPI:  87 y.o. male with medical history significant of Deafness, CAD, DM2, HTN, HLD, dementia..     Per daughter, at baseline: pt with advancing dementia.  Only person who can communicate with him routinely is his son-in-law who has to be present in person.  Pt used to do okay with ASL interpreter over a monitor but now doesn't really do well with video interpreters anymore.  Now a days answers most questions even to son-in-law as yes/no.  Never has done well with lip reading.     Pt sent in from SNF today after they noticed he had L arm weakness and L sided facial droop.  Report that patient had L arm tingling as well.     Was able to get him to use ASL interpreter on video initially in ED, per interpreter pt also using L hand less than his right; 04/23/21 MRI brain indicated No acute intracranial abnormality.  2. Extensive chronic small vessel ischemic disease; motion  degraded; SLE generated.  Assessment / Plan / Recommendation Clinical Impression  Pt seen for limited SLE d/t communication barrier/ASL being primary form of communication with no interpreter available and visual interpreter unsuccessful.  Pt participated with simple conversation through sign language and simple directives given with consideration of baseline level of functioning (ie: advanced dementia); pt oriented to name/place via sign language and able to follow simple 1-2 step directives during OME and swallowing evaluation for functional tasks (ie: "open mouth and stick out tongue"); pt answered yes/no questions with 80% accuracy and followed directives with 1-2 steps with min-mod visual cues with 90% accuracy.  Cognition impaired at baseline and DTA d/t limited communication opportunity with N95 mask impeding lip reading d/t Covid dx.  Pt communicated appropriately via finger spelling and simple signs familiar to SLP during evaluation.  No attempts at verbalizing noted as pt communicates primarily via sign language at baseline.  ST will continue to f/u for speech/language needs during acute stay as well as dysphagia management.    SLP Assessment  SLP Visit Diagnosis: Dysphagia, unspecified (R13.10);Cognitive communication deficit (R41.841)    Follow Up Recommendations  Skilled Nursing facility    Frequency and Duration min 2x/week  1 week      SLP Evaluation Cognition  Overall Cognitive Status: No family/caregiver present to determine baseline cognitive functioning Arousal/Alertness: Awake/alert Orientation Level: Oriented to person;Oriented to place;Other (comment) (Pt communicated  name/place via sign ("hospital"))       Comprehension  Auditory Comprehension Overall Auditory Comprehension: Impaired at baseline Yes/No Questions: Within Functional Limits Commands: Impaired Two Step Basic Commands: 50-74% accurate Conversation: Simple (via ASL) Interfering Components:  Hearing;Processing speed;Working memory EffectiveTechniques: Repetition;Visual/Gestural cues;Extra processing time Visual Recognition/Discrimination Discrimination: Not tested Reading Comprehension Reading Status: Not tested    Expression Expression Primary Mode of Expression: Other (comment) (sign language (ASL)) Verbal Expression Overall Verbal Expression: Impaired at baseline Level of Generative/Spontaneous Verbalization: Phrase (via sign language) Naming: Not tested Interfering Components: Premorbid deficit Non-Verbal Means of Communication: Sign lanquage (ALS or other) (ASL, not ALS) Written Expression Written Expression: Not tested   Oral / Motor  Oral Motor/Sensory Function Overall Oral Motor/Sensory Function: Generalized oral weakness Facial Symmetry: Within Functional Limits Lingual Symmetry: Within Functional Limits Motor Speech Phonation: Other (comment) (n/a) Intelligibility: Unable to assess (comment) Motor Speech Errors: Not applicable                       Tressie Stalker, M.S., CCC-SLP 04/25/2021, 10:59 AM

## 2021-04-26 DIAGNOSIS — G459 Transient cerebral ischemic attack, unspecified: Secondary | ICD-10-CM | POA: Diagnosis not present

## 2021-04-26 LAB — CBC
HCT: 44.8 % (ref 39.0–52.0)
Hemoglobin: 15.1 g/dL (ref 13.0–17.0)
MCH: 31.1 pg (ref 26.0–34.0)
MCHC: 33.7 g/dL (ref 30.0–36.0)
MCV: 92.4 fL (ref 80.0–100.0)
Platelets: 310 10*3/uL (ref 150–400)
RBC: 4.85 MIL/uL (ref 4.22–5.81)
RDW: 13.2 % (ref 11.5–15.5)
WBC: 8.6 10*3/uL (ref 4.0–10.5)
nRBC: 0 % (ref 0.0–0.2)

## 2021-04-26 LAB — GLUCOSE, CAPILLARY
Glucose-Capillary: 111 mg/dL — ABNORMAL HIGH (ref 70–99)
Glucose-Capillary: 116 mg/dL — ABNORMAL HIGH (ref 70–99)
Glucose-Capillary: 124 mg/dL — ABNORMAL HIGH (ref 70–99)
Glucose-Capillary: 158 mg/dL — ABNORMAL HIGH (ref 70–99)
Glucose-Capillary: 176 mg/dL — ABNORMAL HIGH (ref 70–99)
Glucose-Capillary: 97 mg/dL (ref 70–99)

## 2021-04-26 LAB — BASIC METABOLIC PANEL
Anion gap: 6 (ref 5–15)
BUN: 31 mg/dL — ABNORMAL HIGH (ref 8–23)
CO2: 26 mmol/L (ref 22–32)
Calcium: 8.2 mg/dL — ABNORMAL LOW (ref 8.9–10.3)
Chloride: 103 mmol/L (ref 98–111)
Creatinine, Ser: 1.23 mg/dL (ref 0.61–1.24)
GFR, Estimated: 58 mL/min — ABNORMAL LOW (ref >60)
Glucose, Bld: 126 mg/dL — ABNORMAL HIGH (ref 70–99)
Potassium: 3.9 mmol/L (ref 3.5–5.1)
Sodium: 135 mmol/L (ref 135–145)

## 2021-04-26 LAB — C-REACTIVE PROTEIN: CRP: 3 mg/dL — ABNORMAL HIGH (ref <1.0)

## 2021-04-26 LAB — MAGNESIUM: Magnesium: 2.1 mg/dL (ref 1.7–2.4)

## 2021-04-26 MED ORDER — GUAIFENESIN-DM 100-10 MG/5ML PO SYRP
5.0000 mL | ORAL_SOLUTION | ORAL | Status: DC | PRN
  Administered 2021-04-26 – 2021-04-27 (×2): 5 mL via ORAL
  Filled 2021-04-26 (×2): qty 5

## 2021-04-26 NOTE — TOC Initial Note (Signed)
Transition of Care [facility]) - Initial/Assessment Note    Patient Details  Name: Bobby Rojas MRN: [id number] Date of Birth: 04-Jan-1934  [facility] CM/SW Contact:    Baldemar Lenis, LCSW Phone Number: 04/26/2021, 3:26 PM  Clinical Narrative:       CSW contacted [place] to discuss patient's baseline and current care needs. Per nurse tech at [facility], patient requires assist with ADLs but is able to ambulate with a walker independently. CSW discussed how patient was a max A for bed mobility in ED, and the med tech indicated that they still would be able to accommodate the patient if bed bound. CSW then contacted patient's daughter in law, [name], about discharge plans. CSW discussed that [place] indicated that the patient could return, but [name] discussed that she disagreed and if the patient did not show improvement that she thought he might need skilled for a little while. [name] would prefer [name] for SNF if the patient requires. CSW then discussed later with OT who indicated that patient had done really well and could likely return to memory care. CSW to follow for PT to reeval tomorrow for recommendations.             Expected Discharge Plan: Memory Care Barriers to Discharge: Continued Medical Work up, English as a second language teacher   Patient Goals and CMS Choice Patient states their goals for this hospitalization and ongoing recovery are:: patient unable to participate in goal setting, only oriented to self CMS Medicare.gov Compare Post Acute Care list provided to:: Patient Represenative (must comment) Choice offered to / list presented to : Adult Children  Expected Discharge Plan and Services Expected Discharge Plan: Memory Care     Post Acute Care Choice: NA Living arrangements for the past 2 months: Assisted Living Facility                                      Prior Living Arrangements/Services Living arrangements for the past  2 months: Assisted Living Facility Lives with:: Facility Resident Patient language and need for interpreter reviewed:: No Do you feel safe going back to the place where you live?: Yes      Need for Family Participation in Patient Care: Yes (Comment) Care giver support system in place?: Yes (comment) Current home services: DME Criminal Activity/Legal Involvement Pertinent to Current Situation/Hospitalization: No - Comment as needed  Activities of Daily Living      Permission Sought/Granted Permission sought to share information with : Facility Medical sales representative, Family Supports Permission granted to share information with : Yes, Verbal Permission Granted  Share Information with NAME: [name]  Permission granted to share info w AGENCY: SNF, [employer]  Permission granted to share info w Relationship: Daughter in Social worker     Emotional Assessment Appearance:: Appears stated age Attitude/Demeanor/Rapport: Unable to Assess Affect (typically observed): Unable to Assess Orientation: : Oriented to Self Alcohol / Substance Use: Not Applicable Psych Involvement: No (comment)  Admission diagnosis:  Acute ischemic stroke (HCC) [I63.9] Left-sided weakness [R53.1] COVID-19 virus infection [U07.1] Patient Active Problem List   Diagnosis Date Noted   Acute ischemic stroke (HCC) 04/25/2021   TIA (transient ischemic attack) 04/23/2021   COVID-19 virus infection 04/23/2021   HTN (hypertension) 04/23/2021   HLD (hyperlipidemia) 04/23/2021   DM2 (diabetes mellitus, type 2) (HCC) 04/23/2021   Acute metabolic encephalopathy 04/23/2021   PCP:  Jolene Provost, MD Pharmacy:  [id number] - [place], [place] - [address] AT [facility] [address] [place] [address] Phone: [phone] Fax: [phone]     Social Determinants of Health (SDOH) Interventions    Readmission Risk Interventions No flowsheet data found.

## 2021-04-26 NOTE — Progress Notes (Signed)
Physical Therapy Treatment Patient Details Name: Bobby Rojas MRN: [id number] DOB: 05-14-1934 Today's Date: 04/26/2021    History of Present Illness 87 yo male presenting to [facility] ED on 9/6 from SNF secondary to LUE weakness and L facial droop. Imaging negative for acute findings. Found to be + COVID-19 in ED; tested 9/6. PMH: deafness, HTN, DM, and dementia    PT Comments    Pt received in supine, A&O to self/city and with good participation in bed mobility, transfer training and standing exercises for BLE strengthening. Pt making excellent progress toward goals, discussed with supervising PT Charline Bills C and updated disposition/DME recommendations for clarity and PT to update goals per progress. Pt mostly min guard assist for functional mobility tasks with cues for safety/activity pacing. VSS on 3L O2 [place], had to obtain new finger sensor as O2 waveform frequently poor/inaccurate during session (cold fingertips), HR bradycardic at times RN notified.  Pt continues to benefit from PT services to progress toward functional mobility goals.   Follow Up Recommendations  Other (comment) (return to memory care unit)     Equipment Recommendations  Other (comment);Rolling walker with 5" wheels (unclear what DME available at memory care unit; using RW during session)    Recommendations for Other Services       Precautions / Restrictions Precautions Precautions: Fall Precaution Comments: watch SpO2/HR (brady) Restrictions Weight Bearing Restrictions: No    Mobility  Bed Mobility Overal bed mobility: Needs Assistance Bed Mobility: Supine to Sit;Sit to Supine     Supine to sit: Min assist Sit to supine: Min guard   General bed mobility comments: verbal (through translator) and tactile cues for sequencing, use of bed features, increased time.    Transfers Overall transfer level: Needs assistance Equipment used: Rolling walker (2 wheeled) Transfers: Sit to/from [employer] Sit to  Stand: Min guard;Min assist Stand pivot transfers: Min guard       General transfer comment: minA initially from EOB, progressing to min guard from chair/EOB with cues for safe hand placement when standing; pt performed x9 total reps  Ambulation/Gait Ambulation/Gait assistance: Min guard Gait Distance (Feet): 10 Feet (x20 standing pre-gait marches, then 49ft x2 (distance limited due to short O2 cord, no extension or portable tank available at time of session)) Assistive device: Rolling walker (2 wheeled) Gait Pattern/deviations: Step-through pattern;Decreased stride length;Shuffle;Trunk flexed     General Gait Details: small shuffled steps forward/backward in front of chair and pivotal steps to recliner, distance limited due to short nasal cannula cord to wall O2, instead focus on standing therex/pre-gait marching task    Modified Rankin (Stroke Patients Only) Modified Rankin (Stroke Patients Only) Pre-Morbid Rankin Score: Moderate disability (to best of my knowledge, as pt with dementia/may be providing unreliable info) Modified Rankin: Moderately severe disability     Balance Overall balance assessment: Needs assistance Sitting-balance support: No upper extremity supported;Feet supported Sitting balance-Leahy Scale: Fair     Standing balance support: Bilateral upper extremity supported;During functional activity Standing balance-Leahy Scale: Fair Standing balance comment: pt without LOB with BUE support of RW, min guard for safety with standing exercises            Cognition Arousal/Alertness: Awake/alert Behavior During Therapy: WFL for tasks assessed/performed;Impulsive Overall Cognitive Status: History of cognitive impairments - at baseline                                 General Comments: PMH of  dementia. Very pleasant and agreeable. Requiring increased cues at times. Pt very attentive to interpreter Consuella Lose in person via [employer]) and  with good following of 1-step commands, sometimes impulsive but will follow cues to wait.      Exercises Other Exercises Other Exercises: standing BLE AROM: hip flexion (x20 reps), heel raises, hip extension, mini squats x10 reps ea Other Exercises: seated BLE AROM: ankle pumps, LAQ x10 reps ea Other Exercises: STS x 5 reciprocal reps from chair with armrest support    General Comments General comments (skin integrity, edema, etc.): SpO2 with poor waveform throughout session (pt with cold fingertips), when able to get consistent pleth signal reading >95% on 3L O2 [place]; New sensor placed on ear toward end of session in supine, SpO2 100% on 3L resting; HR variable, at times reading 48 bpm resting and up to 80's bpm with exertion; resting HR 53 bpm prior to OOB; BP 138/65 prior to mobility and no dizziness reported      Pertinent Vitals/Pain Pain Assessment: No/denies pain Faces Pain Scale: No hurt     PT Goals (current goals can now be found in the care plan section) Acute Rehab PT Goals Patient Stated Goal: Not stated PT Goal Formulation: Patient unable to participate in goal setting Time For Goal Achievement: 05/08/21 Potential to Achieve Goals: Good Progress towards PT goals: Progressing toward goals    Frequency    Min 2X/week      PT Plan Current plan remains appropriate       AM-PAC PT "6 Clicks" Mobility   Outcome Measure  Help needed turning from your back to your side while in a flat bed without using bedrails?: A Little Help needed moving from lying on your back to sitting on the side of a flat bed without using bedrails?: A Little Help needed moving to and from a bed to a chair (including a wheelchair)?: A Little Help needed standing up from a chair using your arms (e.g., wheelchair or bedside chair)?: A Little Help needed to walk in hospital room?: A Little Help needed climbing 3-5 steps with a railing? : A Lot 6 Click Score: 17    End of Session Equipment  Utilized During Treatment: Gait belt;Oxygen Activity Tolerance: Patient tolerated treatment well Patient left: in bed;with call bell/phone within reach;with bed alarm set;Other (comment) (bed in chair position and pt set up to eat lunch with HOB >50 deg, pt able to demo back use of call bell) Nurse Communication: Mobility status PT Visit Diagnosis: Muscle weakness (generalized) (M62.81)     Time: 6387-5643 PT Time Calculation (min) (ACUTE ONLY): 54 min  Charges:  $Gait Training: 8-22 mins $Therapeutic Exercise: 8-22 mins $Therapeutic Activity: 23-37 mins                     Cody Oliger P., PTA Acute Rehabilitation Services Pager: [phone] Office: [phone]    Dorathy Kinsman Suzzanne Brunkhorst 04/26/2021, 2:07 PM

## 2021-04-26 NOTE — Progress Notes (Signed)
PROGRESS NOTE    Emran Molzahn  ZOX:[id number] DOB: [birth year] DOA: 04/23/2021 PCP: Jolene Provost, MD   Brief Narrative: 87 year old with past medical history significant for deafness, CAD, diabetes type 2, hypertension, hyperlipidemia, dementia.  Per HPI daughter reporter patient has advancing dementia.  Only person who can communicate with him routinely is his son-in-law who has to be present in person.  Patient used to do okay with ASL interpreter over a monitor but now does not really do well with video interpreters anymore.  Now days answer most questions even to son-in-law as yes and no.  Presented from a skilled nursing facility after he was noticed to have left arm weakness and left-sided facial droop.  Report that the patient had left arm tingling as well.  Work-up in the ED: MRI negative for acute stroke, CTA does show high-grade right ICA origin stenosis.  Received Ativan for MRI.  Patient was found to have to be positive for COVID.    Assessment & Plan:   Principal Problem:   TIA (transient ischemic attack) Active Problems:   COVID-19 virus infection   HTN (hypertension)   HLD (hyperlipidemia)   DM2 (diabetes mellitus, type 2) (HCC)   Acute metabolic encephalopathy   Acute ischemic stroke (HCC)   1-TIA: Patient noticed to have left facial weakness, left arm weakness. MRI negative for acute stroke. CTA: Showed high-grade stenosis of the right ICA at its origin with quantification limited by heavily calcified nature of the plaque. Neurology recommended vascular surgery evaluation. Patient was a started on Plavix. Stable, following commands.  Plan to continue with aspirin and plavix.   2-Acute metabolic encephalopathy, on top of underlying dementia: Patient is sleepy and lethargic this morning could be related to Ativan. Component of delirium secondary to COVID He is alert, interactive, he has been able to  participate with sign language interpreter.   3-PNA COVID-19:  Acute Hypoxic respiratory Failure;  -Desat last night to 83 RA (8/08), he required suction.  -Continue with Remdesivir, day 3/4.  -Hypoxic overnight required NB mask 15 L. Repeated chest x ray; Stable basilar atelectasis, left pleural effusion. He was suction with good respond.  -Started on IV Dexamethasone.  -He was wean down to 2 L oxygen.   High-grade stenosis of the right ICA: Vascular consulted. They will review doppler.  Symptomatic.  No plan for surgical intervention, carotid doppler no more than 50% stenosis.  Patient Will need dual antiplatelet therapy aspirin and plavix and statins indefinitely/   DM type 2 Hold home medications. Continue  sliding scale insulin  HTN; holding Norvasc and lisinopril BP normal./   HLD; continue with the statins Hypothyroidism: Continue with Synthroid      Estimated body mass index is 28.11 kg/m as calculated from the following:   Height as of this encounter: 6' (1.829 m).   Weight as of this encounter: 94 kg.   DVT prophylaxis: Lovenox Code Status: DNR.  Discussed with daughter in law who discussed with patient's son, patient is DNR>  Family Communication: Daughter in Social worker over phone.  Disposition Plan:  Status is: Observation  The patient remains OBS appropriate and will d/c before 2 midnights.  Dispo: The patient is from: ALF plus memory care unit.               Anticipated d/c is to: ALF with HH vs SNF              Patient currently is not medically stable to d/c. Plan to  discharge tomorrow/    Difficult to place patient No        Consultants:  Neurology Vascular surgery   Procedures:    Antimicrobials:    Subjective: He is alert, I was able to communicate with him  help of interpreter.  He report breathing well, still coughing.  He was able to say his name. He is more interactive  Objective: Vitals:   04/26/21 0010 04/26/21 0109 04/26/21 0353 04/26/21 0809  BP:  127/60 (!) 121/54 139/72  Pulse:  61  (!)  55  Resp:  14  20  Temp: 98.6 F (37 C)  98.5 F (36.9 C) 98.6 F (37 C)  TempSrc: Axillary  Oral Axillary  SpO2:  92%  96%  Weight:      Height:        Intake/Output Summary (Last 24 hours) at 04/26/2021 1222 Last data filed at 04/26/2021 0600 Gross per 24 hour  Intake 490.08 ml  Output 700 ml  Net -209.92 ml    Filed Weights   04/23/21 1600 04/23/21 1737  Weight: 94 kg 94 kg    Examination:  General exam: NAD Respiratory system: CTA Cardiovascular system:  S 1, S 2 RRR Gastrointestinal system: BS present, soft, nt Central nervous system: Alert, follows command Extremities: no edema   Data Reviewed: I have personally reviewed following labs and imaging studies  CBC: Recent Labs  Lab 04/23/21 1649 04/23/21 1659 04/25/21 0612 04/26/21 0801  WBC 8.1  --  10.5 8.6  NEUTROABS 6.1  --   --   --   HGB 16.3 16.3 16.3 15.1  HCT 49.8 48.0 49.2 44.8  MCV 94.5  --  92.0 92.4  PLT 309  --  332 310    Basic Metabolic Panel: Recent Labs  Lab 04/23/21 1649 04/23/21 1659 04/25/21 0612 04/26/21 0801  NA 138 140 137 135  K 4.7 4.3 4.2 3.9  CL 105 104 104 103  CO2 26  --  25 26  GLUCOSE 132* 128* 130* 126*  BUN 22 26* 21 31*  CREATININE 1.32* 1.30* 1.21 1.23  CALCIUM 8.8*  --  8.9 8.2*  MG  --   --   --  2.1    GFR: Estimated Creatinine Clearance: 52.3 mL/min (by C-G formula based on SCr of 1.23 mg/dL). Liver Function Tests: Recent Labs  Lab 04/23/21 1649  AST 34  ALT 18  ALKPHOS 51  BILITOT 1.3*  PROT 6.1*  ALBUMIN 3.6    No results for input(s): LIPASE, AMYLASE in the last 168 hours. No results for input(s): AMMONIA in the last 168 hours. Coagulation Profile: Recent Labs  Lab 04/23/21 1649  INR 1.2    Cardiac Enzymes: No results for input(s): CKTOTAL, CKMB, CKMBINDEX, TROPONINI in the last 168 hours. BNP (last 3 results) No results for input(s): PROBNP in the last 8760 hours. HbA1C: Recent Labs    04/24/21 0434  HGBA1C 6.0*     CBG: Recent Labs  Lab 04/25/21 1609 04/25/21 2111 04/26/21 0017 04/26/21 0400 04/26/21 0828  GLUCAP 163* 164* 158* 124* 116*    Lipid Profile: Recent Labs    04/24/21 0436  CHOL 122  HDL 42  LDLCALC 68  TRIG 61  CHOLHDL 2.9    Thyroid Function Tests: No results for input(s): TSH, T4TOTAL, FREET4, T3FREE, THYROIDAB in the last 72 hours. Anemia Panel: No results for input(s): VITAMINB12, FOLATE, FERRITIN, TIBC, IRON, RETICCTPCT in the last 72 hours. Sepsis Labs: No results for input(s): PROCALCITON,  LATICACIDVEN in the last 168 hours.  Recent Results (from the past 240 hour(s))  Resp Panel by RT-PCR (Flu A&B, Covid) Nasopharyngeal Swab     Status: Abnormal   Collection Time: 04/23/21  6:31 PM   Specimen: Nasopharyngeal Swab; Nasopharyngeal(NP) swabs in vial transport medium  Result Value Ref Range Status   SARS Coronavirus 2 by RT PCR POSITIVE (A) NEGATIVE Final    Comment: RESULT CALLED TO, READ BACK BY AND VERIFIED WITH: DACIA HARRIS 04/23/2021  BY JW (NOTE) SARS-CoV-2 target nucleic acids are DETECTED.  The SARS-CoV-2 RNA is generally detectable in upper respiratory specimens during the acute phase of infection. Positive results are indicative of the presence of the identified virus, but do not rule out bacterial infection or co-infection with other pathogens not detected by the test. Clinical correlation with patient history and other diagnostic information is necessary to determine patient infection status. The expected result is Negative.  Fact Sheet for Patients: BloggerCourse.com  Fact Sheet for Healthcare Providers: SeriousBroker.it  This test is not yet approved or cleared by the [place] FDA and  has been authorized for detection and/or diagnosis of SARS-CoV-2 by FDA under an Emergency Use Authorization (EUA).  This EUA will remain in effect (meaning this test can be  used) for the  duration of  the COVID-19 declaration under Section 564(b)(1) of the Act, 21 U.S.C. section 360bbb-3(b)(1), unless the authorization is terminated or revoked sooner.     Influenza A by PCR NEGATIVE NEGATIVE Final   Influenza B by PCR NEGATIVE NEGATIVE Final    Comment: (NOTE) The Xpert Xpress SARS-CoV-2/FLU/RSV plus assay is intended as an aid in the diagnosis of influenza from Nasopharyngeal swab specimens and should not be used as a sole basis for treatment. Nasal washings and aspirates are unacceptable for Xpert Xpress SARS-CoV-2/FLU/RSV testing.  Fact Sheet for Patients: BloggerCourse.com  Fact Sheet for Healthcare Providers: SeriousBroker.it  This test is not yet approved or cleared by the [place] FDA and has been authorized for detection and/or diagnosis of SARS-CoV-2 by FDA under an Emergency Use Authorization (EUA). This EUA will remain in effect (meaning this test can be used) for the duration of the COVID-19 declaration under Section 564(b)(1) of the Act, 21 U.S.C. section 360bbb-3(b)(1), unless the authorization is terminated or revoked.  Performed at [facility], [address] [place], [name] [address]           Radiology Studies: DG CHEST PORT 1 VIEW  Result Date: 04/25/2021 CLINICAL DATA:  Hypoxia, dyspnea EXAM: PORTABLE CHEST 1 VIEW COMPARISON:  04/23/2021 FINDINGS: Mild left basilar atelectasis. Small left pleural effusion again noted. Lungs are otherwise clear. No pneumothorax. No pleural effusion on the right. Cardiac size within normal limits. Pulmonary vascularity is normal. IMPRESSION: Stable left basilar atelectasis and small left pleural effusion. Electronically Signed   By: Helyn Numbers M.D.   On: 04/25/2021 00:21   VAS US CAROTID (at [facility] and [facility] only)  Result Date: 04/26/2021 Carotid Arterial Duplex Study Patient Name:  Bobby Rojas  Date of Exam:   04/24/2021 Medical Rec #: [id number]    Accession #:    [id number] Date of Birth: 1933-12-02   Patient Gender: M Patient Age:   38 years Exam Location:  [facility] Procedure:      VAS US CAROTID Referring Phys: Lyda Perone --------------------------------------------------------------------------------  Indications:      CVA. Risk Factors:     Hypertension, hyperlipidemia, Diabetes, coronary artery  disease. Comparison Study: no prior Performing Technologist: [place] Ponder RVS  Examination Guidelines: A complete evaluation includes B-mode imaging, spectral Doppler, color Doppler, and power Doppler as needed of all accessible portions of each vessel. Bilateral testing is considered an integral part of a complete examination. Limited examinations for reoccurring indications may be performed as noted.  Right Carotid Findings: +----------+--------+--------+--------+--------------------+-------------------+           PSV cm/sEDV cm/sStenosisPlaque Description  Comments            +----------+--------+--------+--------+--------------------+-------------------+ CCA Prox  65      10              heterogenous                            +----------+--------+--------+--------+--------------------+-------------------+ CCA Distal65      9               heterogenous and                                                          calcific                                +----------+--------+--------+--------+--------------------+-------------------+ ICA Prox  92      16      1-39%   heterogenous        Velocities may                                                            underestimate                                                             degree of stenosis                                                        due to more                                                               proximal                                                                  obstruction.         +----------+--------+--------+--------+--------------------+-------------------+  ICA Distal55      17                                                      +----------+--------+--------+--------+--------------------+-------------------+ ECA       62      7                                                       +----------+--------+--------+--------+--------------------+-------------------+ +----------+--------+-------+--------+-------------------+           PSV cm/sEDV cmsDescribeArm Pressure (mmHG) +----------+--------+-------+--------+-------------------+ [id number]                                         +----------+--------+-------+--------+-------------------+ +---------+--------+--+--------+--+---------+ VertebralPSV cm/s32EDV cm/s10Antegrade +---------+--------+--+--------+--+---------+  Left Carotid Findings: +----------+--------+--------+--------+------------------+--------+           PSV cm/sEDV cm/sStenosisPlaque DescriptionComments +----------+--------+--------+--------+------------------+--------+ CCA Prox  80                      heterogenous               +----------+--------+--------+--------+------------------+--------+ CCA Distal59      13              heterogenous               +----------+--------+--------+--------+------------------+--------+ ICA Prox  58      16      1-39%   heterogenous               +----------+--------+--------+--------+------------------+--------+ ICA Distal65      19                                         +----------+--------+--------+--------+------------------+--------+ ECA       66      11                                         +----------+--------+--------+--------+------------------+--------+ +----------+--------+--------+--------+-------------------+           PSV cm/sEDV cm/sDescribeArm Pressure (mmHG) +----------+--------+--------+--------+-------------------+ Subclavian105                                          +----------+--------+--------+--------+-------------------+ +---------+--------+---+--------+--+---------+ VertebralPSV cm/s101EDV cm/s18Antegrade +---------+--------+---+--------+--+---------+   Summary: Right Carotid: Velocities in the right ICA are consistent with a 1-39% stenosis. Left Carotid: Velocities in the left ICA are consistent with a 1-39% stenosis. Vertebrals: Bilateral vertebral arteries demonstrate antegrade flow. *See table(s) above for measurements and observations.  Electronically signed by Marvel PlanJindong Xu MD on 04/26/2021 at 8:29:05 AM.    Final         Scheduled Meds:  aspirin EC  81 mg Oral Daily   atorvastatin  40 mg Oral QHS   clopidogrel  300 mg Oral Once   Followed by   clopidogrel  75 mg Oral  Daily   dexamethasone (DECADRON) injection  6 mg Intravenous Q24H   divalproex  375 mg Oral BID   enoxaparin (LOVENOX) injection  40 mg Subcutaneous Q24H   insulin aspart  0-9 Units Subcutaneous Q4H   levothyroxine  75 mcg Oral QAC breakfast   Continuous Infusions:  sodium chloride 250 mL (04/24/21 1013)   remdesivir 100 mg in NS 100 mL 100 mg (04/26/21 1205)     LOS: 1 day    Time spent: 35 minutes    Brynn Mulgrew A Adley Castello, MD [facility]   If 7PM-7AM, please contact night-coverage [email or web address]  04/26/2021, 12:22 PM

## 2021-04-26 NOTE — Progress Notes (Signed)
  Speech Language Pathology Treatment: Dysphagia  Patient Details Name: Bobby Rojas MRN: [id number] DOB: Nov 29, 1933 Today's Date: 04/26/2021 Time: 1202-1230 SLP Time Calculation (min) (ACUTE ONLY): 28 min  Assessment / Plan / Recommendation Clinical Impression  Pt alert and repositioned upright in bed with RN assist. Interpreter able to be present for part of session to assist with communication of diet recommendations, pt progress with SLP POC and to facilitate any questions between clinician and pt. Pt reports to have largely consumed soft foods at baseline, with no difficulty with either solids or liquids. This date, he consumed thin liquids via single straw sips and bites of puree without overt s/sx of aspiration. Overt cough x1 noted with regular textured solid, with quick recovery. Simulated dys 2 solid was unremarkable for symptoms of aspiration and compete oral clearance noted. Given more stable respiratory status and improved mentation per RN, recommend dysphagia 2 diet, thin liquids. SLP to f/u.   HPI HPI: 87 y.o. male with medical history significant of Deafness, CAD, DM2, HTN, HLD, dementia..     Per daughter, at baseline: pt with advancing dementia.  Only person who can communicate with him routinely is his son-in-law who has to be present in person.  Pt used to do okay with ASL interpreter over a monitor but now doesn't really do well with video interpreters anymore.  Now a days answers most questions even to son-in-law as yes/no.  Never has done well with lip reading.     Pt sent in from SNF today after they noticed he had L arm weakness and L sided facial droop.  Report that patient had L arm tingling as well.     Was able to get him to use ASL interpreter on video initially in ED, per interpreter pt also using L hand less than his right; 04/23/21 MRI brain indicated No acute intracranial abnormality.  2. Extensive chronic small vessel ischemic disease; motion degraded; BSE ordered d/t pt  failing [facility] swallow screen.      SLP Plan  Continue with current plan of care       Recommendations  Diet recommendations: Dysphagia 2 (fine chop);Thin liquid Liquids provided via: Straw;Cup Medication Administration: Crushed with puree Supervision: Patient able to self feed;Full supervision/cueing for compensatory strategies Compensations: Minimize environmental distractions;Slow rate;Small sips/bites Postural Changes and/or Swallow Maneuvers: Seated upright 90 degrees                Oral Care Recommendations: Oral care BID;Staff/trained caregiver to provide oral care Follow up Recommendations: Skilled Nursing facility SLP Visit Diagnosis: Dysphagia, unspecified (R13.10);Cognitive communication deficit (R41.841) Plan: Continue with current plan of care       GO              Avie Echevaria, MA, CCC-SLP Acute Rehabilitation Services Office Number: [phone]   Paulette Blanch 04/26/2021, 12:58 PM

## 2021-04-26 NOTE — TOC Progression Note (Signed)
Transition of Care [facility]) - Progression Note    Patient Details  Name: Bobby Rojas MRN: [id number] Date of Birth: [birth year]  Transition of Care [facility]) CM/SW Contact  Bobby Rojas, [name] Phone Number: 04/26/2021, 3:29 PM  Clinical Narrative:   CSW assisted with coordinating sign language interpreter for MD, sign language interpreter came to assist with PT eval, as well. PT indicating that patient should be able to return to memory care facility. CSW spoke with [facility] and discussed his improvements, they will be able to take the patient back. CSW confirmed that they can take him back while still needing covid isolation and they are able to do that. CSW alerted them that he will likely be ready over the weekend, and they will be ready to admit back. CSW then spoke with daughter in law, Bobby Rojas, to provide update. Bobby Rojas appreciative of update and glad that the patient is doing well. CSW discussed with [place] and Bobby Rojas about setting up home health, and neither have a preference on agency. CSW working to find home health agency to accept patient.     Expected Discharge Plan: Memory Care Barriers to Discharge: Continued Medical Work up, English as a second language teacher  Expected Discharge Plan and Services Expected Discharge Plan: Memory Care     Post Acute Care Choice: NA Living arrangements for the past 2 months: Assisted Living Facility                                       Social Determinants of Health (SDOH) Interventions    Readmission Risk Interventions No flowsheet data found.

## 2021-04-26 NOTE — Plan of Care (Signed)

## 2021-04-26 NOTE — Progress Notes (Signed)
[facility]  Subjective  -moving all extremities   Objective 139/72 (!) 55 98.6 F (37 C) (Axillary) 20 96%  Intake/Output Summary (Last 24 hours) at 04/26/2021 1205 Last data filed at 04/26/2021 0600 Gross per 24 hour  Intake 490.08 ml  Output 700 ml  Net -209.92 ml   No deficits on exam, moving all extremities  Laboratory Lab Results: Recent Labs    04/25/21 0612 04/26/21 0801  WBC 10.5 8.6  HGB 16.3 15.1  HCT 49.2 44.8  PLT 332 310   BMET Recent Labs    04/25/21 0612 04/26/21 0801  NA 137 135  K 4.2 3.9  CL 104 103  CO2 25 26  GLUCOSE 130* 126*  BUN 21 31*  CREATININE 1.21 1.23  CALCIUM 8.9 8.2*    COAG Lab Results  Component Value Date   INR 1.2 04/23/2021   No results found for: PTT  Assessment/Planning:  87 year old male who is deaf, mute, demented and lives in an assisted living memory care unit that vascular surgery was consulted for symptomatic right carotid stenosis.  MRI did not reveal any acute event and this was felt to be a TIA with reported left sided weakness.  CTA neck showed heavy calcified lesion at the carotid bifurcation on the right that was hard to quantify and ultimately we recommended a carotid ultrasound/duplex.  Carotid duplex confirms 1 to 39% stenosis on the right.  I have reviewed these images.  I have discussed with Dr. Roda Shutters with neurology.  I do not think the patient would benefit from carotid intervention at this time given his stenosis is less than 50% by velocity criteria on the right.  I would favor medical management.     Would recommend dual antiplatelet therapy for best medical management.  Will arrange follow-up in 6 months with carotid duplex in our office.  Updated patients family by phone.  Cephus Shelling 04/26/2021 12:05 PM --

## 2021-04-27 DIAGNOSIS — G459 Transient cerebral ischemic attack, unspecified: Secondary | ICD-10-CM | POA: Diagnosis not present

## 2021-04-27 LAB — BASIC METABOLIC PANEL
Anion gap: 10 (ref 5–15)
BUN: 29 mg/dL — ABNORMAL HIGH (ref 8–23)
CO2: 23 mmol/L (ref 22–32)
Calcium: 8.5 mg/dL — ABNORMAL LOW (ref 8.9–10.3)
Chloride: 102 mmol/L (ref 98–111)
Creatinine, Ser: 1.2 mg/dL (ref 0.61–1.24)
GFR, Estimated: 59 mL/min — ABNORMAL LOW (ref >60)
Glucose, Bld: 210 mg/dL — ABNORMAL HIGH (ref 70–99)
Potassium: 4.3 mmol/L (ref 3.5–5.1)
Sodium: 135 mmol/L (ref 135–145)

## 2021-04-27 LAB — GLUCOSE, CAPILLARY
Glucose-Capillary: 117 mg/dL — ABNORMAL HIGH (ref 70–99)
Glucose-Capillary: 145 mg/dL — ABNORMAL HIGH (ref 70–99)
Glucose-Capillary: 158 mg/dL — ABNORMAL HIGH (ref 70–99)
Glucose-Capillary: 193 mg/dL — ABNORMAL HIGH (ref 70–99)
Glucose-Capillary: 202 mg/dL — ABNORMAL HIGH (ref 70–99)

## 2021-04-27 LAB — MAGNESIUM: Magnesium: 2 mg/dL (ref 1.7–2.4)

## 2021-04-27 MED ORDER — DEXAMETHASONE 6 MG PO TABS: 6.0000 mg | ORAL_TABLET | Freq: Every day | ORAL | 0 refills | Status: AC

## 2021-04-27 MED ORDER — GUAIFENESIN-DM 100-10 MG/5ML PO SYRP: 5.0000 mL | ORAL_SOLUTION | ORAL | 0 refills | Status: AC | PRN

## 2021-04-27 MED ORDER — CLOPIDOGREL BISULFATE 75 MG PO TABS: 75.0000 mg | ORAL_TABLET | Freq: Every day | ORAL | 3 refills | Status: DC

## 2021-04-27 MED ORDER — CLOPIDOGREL BISULFATE 75 MG PO TABS: 75.0000 mg | ORAL_TABLET | Freq: Every day | ORAL | 3 refills | Status: AC

## 2021-04-27 NOTE — Plan of Care (Signed)
Pt is alert, pt is mute and deaf. But follows demonstrations. Pt resting. Pt noted coughing, prn robitussin given. Pt turned in bed q2. Pt has condom cath in place. No distress noted.    Problem: Education: Goal: Knowledge of General Education information will improve Description: Including pain rating scale, medication(s)/side effects and non-pharmacologic comfort measures Outcome: Progressing   Problem: Health Behavior/Discharge Planning: Goal: Ability to manage health-related needs will improve Outcome: Progressing   Problem: Clinical Measurements: Goal: Ability to maintain clinical measurements within normal limits will improve Outcome: Progressing Goal: Will remain free from infection Outcome: Progressing Goal: Diagnostic test results will improve Outcome: Progressing Goal: Respiratory complications will improve Outcome: Progressing Goal: Cardiovascular complication will be avoided Outcome: Progressing   Problem: Activity: Goal: Risk for activity intolerance will decrease Outcome: Progressing   Problem: Nutrition: Goal: Adequate nutrition will be maintained Outcome: Progressing   Problem: Coping: Goal: Level of anxiety will decrease Outcome: Progressing   Problem: Elimination: Goal: Will not experience complications related to bowel motility Outcome: Progressing Goal: Will not experience complications related to urinary retention Outcome: Progressing   Problem: Pain Managment: Goal: General experience of comfort will improve Outcome: Progressing   Problem: Safety: Goal: Ability to remain free from injury will improve Outcome: Progressing   Problem: Skin Integrity: Goal: Risk for impaired skin integrity will decrease Outcome: Progressing

## 2021-04-27 NOTE — NC FL2 (Signed)
[place] MEDICAID FL2 LEVEL OF CARE SCREENING TOOL     IDENTIFICATION  Patient Name: Bobby Rojas Birthdate: 10/19/1933 Sex: male Admission Date (Current Location): 04/23/2021  [facility] and [place] Number:  Producer, television/film/video and Address:  [facility], [address] [place], [name] [address]      Provider Number: [id number]  Attending Physician Name and Address:  Alba Cory, MD  Relative Name and Phone Number:  Ygnacio Fecteau, [phone]    Current Level of Care: Hospital Recommended Level of Care: Assisted Living Facility Prior Approval Number:    Date Approved/Denied:   PASRR Number:    Discharge Plan: Other (Comment) (ALF)    Current Diagnoses: Patient Active Problem List   Diagnosis Date Noted   Acute ischemic stroke (HCC) 04/25/2021   TIA (transient ischemic attack) 04/23/2021   COVID-19 virus infection 04/23/2021   HTN (hypertension) 04/23/2021   HLD (hyperlipidemia) 04/23/2021   DM2 (diabetes mellitus, type 2) (HCC) 04/23/2021   Acute metabolic encephalopathy 04/23/2021    Orientation RESPIRATION BLADDER Height & Weight      (Unable to Assess)  O2 ([place] 2) Incontinent Weight: 207 lb 3.7 oz (94 kg) Height:  6' (182.9 cm)  BEHAVIORAL SYMPTOMS/MOOD NEUROLOGICAL BOWEL NUTRITION STATUS      Continent Diet (See DC summary)  AMBULATORY STATUS COMMUNICATION OF NEEDS Skin   Limited Assist Non-Verbally Normal                       Personal Care Assistance Level of Assistance  Bathing, Feeding, Dressing Bathing Assistance: Limited assistance Feeding assistance: Independent Dressing Assistance: Limited assistance     Functional Limitations Info  Sight, Hearing, Speech Sight Info: Adequate Hearing Info: Impaired Speech Info: Impaired    SPECIAL CARE FACTORS FREQUENCY  PT (By licensed PT), OT (By licensed OT)     PT Frequency: 2x week OT Frequency: 2x week            Contractures Contractures Info: Not present     Additional Factors Info  Code Status, Allergies, Insulin Sliding Scale, Psychotropic Code Status Info: DNR Allergies Info: NKA Psychotropic Info: Divaloprex (Depakote) Insulin Sliding Scale Info: Insulin Aspart (Novolog) 0-9 U every 4 hours       Current Medications (04/27/2021):  This is the current hospital active medication list Current Facility-Administered Medications  Medication Dose Route Frequency Provider Last Rate Last Admin   0.9 %  sodium chloride infusion   Intravenous PRN Cheryll Cockayne, MD 10 mL/hr at 04/27/21 1217 250 mL at 04/27/21 1217   acetaminophen (TYLENOL) tablet 650 mg  650 mg Oral Q4H PRN Hillary Bow, DO       Or   acetaminophen (TYLENOL) 160 MG/5ML solution 650 mg  650 mg Per Tube Q4H PRN Hillary Bow, DO       Or   acetaminophen (TYLENOL) suppository 650 mg  650 mg Rectal Q4H PRN Hillary Bow, DO       aspirin EC tablet 81 mg  81 mg Oral Daily Marvel Plan, MD   81 mg at 04/27/21 1053   atorvastatin (LIPITOR) tablet 40 mg  40 mg Oral QHS Marvel Plan, MD   40 mg at 04/26/21 2213   clopidogrel (PLAVIX) tablet 300 mg  300 mg Oral Once Marvel Plan, MD       Followed by   clopidogrel (PLAVIX) tablet 75 mg  75 mg Oral Daily Marvel Plan, MD   [phone]  mg at 04/27/21 1053   dexamethasone (DECADRON) injection 6 mg  6 mg Intravenous Q24H Regalado, Belkys A, MD   6 mg at 04/27/21 1452   divalproex (DEPAKOTE SPRINKLE) capsule 375 mg  375 mg Oral BID Regalado, Belkys A, MD   375 mg at 04/27/21 1053   enoxaparin (LOVENOX) injection 40 mg  40 mg Subcutaneous Q24H Julian Reil, Jared M, DO   40 mg at 04/26/21 2213   guaiFENesin-dextromethorphan (ROBITUSSIN DM) 100-10 MG/5ML syrup 5 mL  5 mL Oral Q4H PRN Regalado, Belkys A, MD   5 mL at 04/27/21 0510   insulin aspart (novoLOG) injection 0-9 Units  0-9 Units Subcutaneous Q4H Hillary Bow, DO   1 Units at 04/27/21 0743   levothyroxine (SYNTHROID) tablet 75 mcg  75 mcg Oral QAC breakfast Regalado, Belkys A, MD   75 mcg at  04/27/21 0510     Discharge Medications: aspirin EC 81 MG tablet Take 81 mg by mouth daily. Swallow whole.    atorvastatin 40 MG tablet Commonly known as: LIPITOR Take 40 mg by mouth at bedtime.    clopidogrel 75 MG tablet Commonly known as: PLAVIX Take 1 tablet (75 mg total) by mouth daily.    dexamethasone 6 MG tablet Commonly known as: DECADRON Take 1 tablet (6 mg total) by mouth daily.    divalproex 125 MG capsule Commonly known as: DEPAKOTE SPRINKLE Take 375 mg by mouth 2 (two) times daily.    guaiFENesin-dextromethorphan 100-10 MG/5ML syrup Commonly known as: ROBITUSSIN DM Take 5 mLs by mouth every 4 (four) hours as needed for cough.    levothyroxine 75 MCG tablet Commonly known as: SYNTHROID Take 75 mcg by mouth daily before breakfast.    lisinopril 40 MG tablet Commonly known as: ZESTRIL Take 40 mg by mouth daily.    rOPINIRole 0.5 MG tablet Commonly known as: REQUIP Take 0.5 mg by mouth at bedtime.    Vitamin D3 25 MCG (1000 UT) Caps Take 1,000 Units by mouth daily.      Relevant Imaging Results:  Relevant Lab Results:   Additional Information SS# [id number], [address]

## 2021-04-27 NOTE — TOC Progression Note (Signed)
Transition of Care [facility]) - Progression Note    Patient Details  Name: Bobby Rojas MRN: [id number] Date of Birth: 1934-06-17  Transition of Care [facility]) CM/SW Contact  Carley Hammed, [place] Phone Number: 04/27/2021, 4:07 PM  Clinical Narrative:    CSW was notified by transportation that there is a long wait. MD requested pt not be transported late so DC was held. Daughter and facility notified and agreeable. TOC will continue to follow.   Expected Discharge Plan: Memory Care Barriers to Discharge: No Barriers Identified  Expected Discharge Plan and Services Expected Discharge Plan: Memory Care     Post Acute Care Choice: NA Living arrangements for the past 2 months: Assisted Living Facility Expected Discharge Date: 04/27/21               DME Arranged: Oxygen DME Agency: [employer] Date DME Agency Contacted: 04/27/21 Time DME Agency Contacted: 1206 Representative spoke with at DME Agency: Vaughan Basta HH Arranged: PT, OT HH Agency: [facility] Date [facility] Agency Contacted: 04/27/21 Time HH Agency Contacted: 1207 Representative spoke with at [facility] Agency: Kandee Keen   Social Determinants of Health (SDOH) Interventions    Readmission Risk Interventions No flowsheet data found.

## 2021-04-27 NOTE — Progress Notes (Signed)
MD made aware that patients HR is in the 40's. MD is not concerned and asked to stop IV drip. Medication can decrease HR. This Rn made MD aware that this RN will not be ambulating patient until HR increases. This RN will continue to monitor.

## 2021-04-27 NOTE — TOC Transition Note (Addendum)
Transition of Care [facility]) - CM/SW Discharge Note   Patient Details  Name: Kalel Harty MRN: [id number] Date of Birth: [birth year]  Transition of Care [facility]) CM/SW Contact:  Lawerance Sabal, RN Phone Number: 04/27/2021, 12:07 PM   Clinical Narrative:    Patient resides at [facility] Memory care [address] [place] [id number]  Spoke w admission RN. Zella Ball, direct number [phone].   Facility has no preference for DME company. O2 referral made to [employer] who will deliver oxygen today (pending ambulatory sats, CM requested sats from RN as soon as possible as to not delay DC).  HH services referral made yesterday to [facility], confirmed w liaison and notified of DC today.  CSW has set up PT[facility] transport. PTAR running on significant delay.   CM following for ambulatory sats and delivery of oxygen to facility.   13:30 Patient with low heart rate, nurse not ambulating at this time. Will not be able to get ambulatory sats until HR improves, therefore will not be able to get DME O2 order fullfilled. CM cancelled PT[facility] at this time as to prevent patient being DC'd prior to O2 arriving at facility.  15:30 Labs still pending, MD unsure if patient will DC today. Verified w Vaughan Basta that O2 has been delivered to ALF. Provided nurse with number to call [facility] in case DC determination is made after 5pm.     Final next level of care: Home w Home Health Services Barriers to Discharge: No Barriers Identified   Patient Goals and CMS Choice Patient states their goals for this hospitalization and ongoing recovery are:: patient unable to participate in goal setting, only oriented to self CMS Medicare.gov Compare Post Acute Care list provided to:: Patient Represenative (must comment) Choice offered to / list presented to : Adult Children  Discharge Placement                       Discharge Plan and Services     Post Acute Care Choice: NA          DME Arranged:  Oxygen DME Agency: [employer] Date DME Agency Contacted: 04/27/21 Time DME Agency Contacted: 1206 Representative spoke with at DME Agency: Vaughan Basta HH Arranged: PT, OT HH Agency: [facility] Date [facility] Agency Contacted: 04/27/21 Time HH Agency Contacted: 1207 Representative spoke with at [facility] Agency: Kandee Keen  Social Determinants of Health (SDOH) Interventions     Readmission Risk Interventions No flowsheet data found.

## 2021-04-27 NOTE — Plan of Care (Signed)

## 2021-04-27 NOTE — Discharge Summary (Addendum)
Physician Discharge Summary  Bren Steers ZOX:[id number] DOB: Oct 23, 1933 DOA: 04/23/2021  PCP: Jolene Provost, MD  Admit date: 04/23/2021 Discharge date: 04/27/2021  Admitted From: ALF Disposition:  ALF  Recommendations for Outpatient Follow-up:  Follow up with PCP in 1-2 weeks Please obtain BMP/CBC in one week Needs to follow up with Neurology after TIA.  He will need aspirin and plavix and statins for medical therapy of Carotid artery stenosis,.     Discharge Condition: Stable.  CODE STATUS: DNR Diet recommendation: Dysphagia 2 diet  Brief/Interim Summary: 87 year old with past medical history significant for deafness, CAD, diabetes type 2, hypertension, hyperlipidemia, dementia.  Per HPI daughter reporter patient has advancing dementia.  Only person who can communicate with him routinely is his son-in-law who has to be present in person.  Patient used to do okay with ASL interpreter over a monitor but now does not really do well with video interpreters anymore.  Now days answer most questions even to son-in-law as yes and no.  Presented from a skilled nursing facility after he was noticed to have left arm weakness and left-sided facial droop.  Report that the patient had left arm tingling as well.   Work-up in the ED: MRI negative for acute stroke, CTA does show high-grade right ICA origin stenosis.  Received Ativan for MRI.  Patient was found to have to be positive for COVID.  1-TIA: Patient noticed to have left facial weakness, left arm weakness. MRI negative for acute stroke. CTA: Showed high-grade stenosis of the right ICA at its origin with quantification limited by heavily calcified nature of the plaque. Neurology recommended vascular surgery evaluation. Patient was a started on Plavix. Stable, following commands.  Plan to continue with aspirin and plavix. he will need aspirin and Plavix as well for carotid artery diseases.    2-Acute metabolic encephalopathy, on top of  underlying dementia: Patient is sleepy and lethargic this morning could be related to Ativan. Component of delirium secondary to COVID He is alert, follows demonstration command.    3-PNA COVID-19: Acute Hypoxic respiratory Failure;  -Desat last night to 83 RA (8/08), he required suction.  -Continue with Remdesivir, day 3/4.  -Hypoxic overnight required NB mask 15 L. Repeated chest x ray; Stable basilar atelectasis, left pleural effusion. He was suction with good respond.  -Started on IV Dexamethasone. Plan to discharge on 3 more days of dexamethasone.  -He was wean down to 2 L oxygen. will access home oxygen needs.    High-grade stenosis of the right ICA: Vascular consulted. They will review doppler.  Symptomatic.  No plan for surgical intervention, carotid doppler no more than 50% stenosis.  Patient Will need dual antiplatelet therapy aspirin and plavix and statins indefinitely/    DM type 2 On diet  Continue  sliding scale insulin   HTN; Holding Norvasc and resume lisinopril.  Dementia; hold namenda due to bradycardia.  Risperidone on hold on admission due to AMS,. He has not required risperidone.  HLD; Continue with the statins Hypothyroidism: Continue with Synthroid Bradycardia; Asymptomatic. HR low while sleep and while he was getting Remdesivir.  Remdesivir discontinue. Plan to check labs. Subsequently HR increase to 50.       Discharge Diagnoses:  Principal Problem:   TIA (transient ischemic attack) Active Problems:   COVID-19 virus infection   HTN (hypertension)   HLD (hyperlipidemia)   DM2 (diabetes mellitus, type 2) (HCC)   Acute metabolic encephalopathy   Acute ischemic stroke [facility])    Discharge Instructions  Discharge Instructions     Ambulatory referral to Neurology   Complete by: As directed    Follow up with Dr. Marjory Lies at [facility] in 4 weeks.  Patient is Dr. Richrd Humbles patient. Thanks.   Diet - low sodium heart healthy   Complete by: As directed     Increase activity slowly   Complete by: As directed       Allergies as of 04/27/2021   No Known Allergies      Medication List     STOP taking these medications    amLODipine 5 MG tablet Commonly known as: NORVASC   memantine 10 MG tablet Commonly known as: NAMENDA   risperiDONE 0.25 MG tablet Commonly known as: RISPERDAL       TAKE these medications    aspirin EC 81 MG tablet Take 81 mg by mouth daily. Swallow whole.   atorvastatin 40 MG tablet Commonly known as: LIPITOR Take 40 mg by mouth at bedtime.   clopidogrel 75 MG tablet Commonly known as: PLAVIX Take 1 tablet (75 mg total) by mouth daily.   dexamethasone 6 MG tablet Commonly known as: DECADRON Take 1 tablet (6 mg total) by mouth daily.   divalproex 125 MG capsule Commonly known as: DEPAKOTE SPRINKLE Take 375 mg by mouth 2 (two) times daily.   guaiFENesin-dextromethorphan 100-10 MG/5ML syrup Commonly known as: ROBITUSSIN DM Take 5 mLs by mouth every 4 (four) hours as needed for cough.   levothyroxine 75 MCG tablet Commonly known as: SYNTHROID Take 75 mcg by mouth daily before breakfast.   lisinopril 40 MG tablet Commonly known as: ZESTRIL Take 40 mg by mouth daily.   rOPINIRole 0.5 MG tablet Commonly known as: REQUIP Take 0.5 mg by mouth at bedtime.   Vitamin D3 25 MCG (1000 UT) Caps Take 1,000 Units by mouth daily.               Durable Medical Equipment  (From admission, onward)           Start     Ordered   04/27/21 0734  For home use only DME oxygen  Once       Question Answer Comment  Length of Need 6 Months   Mode or (Route) Nasal cannula   Liters per Minute 2   Frequency Continuous (stationary and portable oxygen unit needed)   Oxygen delivery system Gas      04/27/21 0733            Follow-up Information     Penumalli, Glenford Bayley, MD. Schedule an appointment as soon as possible for a visit in 1 month(s).   Specialties: Neurology, Radiology Contact  information: [address] [name] [address] [phone]         [facility], [facility] Follow up.   Specialty: Home Health Services Why: Representative from [facility] will contact you to schedule start of home health services. Contact information: [address] [place], [name] [address] [phone]                No Known Allergies  Consultations: Neurology Vascular.    Procedures/Studies: MR ANGIO HEAD WO CONTRAST  Result Date: 04/23/2021 CLINICAL DATA:  Stroke, follow up. Left-sided weakness and facial droop. EXAM: MRI HEAD WITHOUT CONTRAST MRA HEAD WITHOUT CONTRAST TECHNIQUE: Multiplanar, multi-echo pulse sequences of the brain and surrounding structures were acquired without intravenous contrast. Angiographic images of the Circle of Willis were acquired using MRA technique without intravenous contrast. COMPARISON:  Head  CT and CTA 04/23/2021.  Head MRI 10/20/2017. FINDINGS: MRI HEAD FINDINGS The study is intermittently up to moderately motion degraded. Brain: There is no evidence of an acute infarct, intracranial hemorrhage, mass, midline shift, or extra-axial fluid collection. Small chronic infarcts are again noted in the right frontal lobe and right cerebellar hemisphere. T2 hyperintensities elsewhere in the cerebral white matter bilaterally have mildly progressed from the prior MRI and are nonspecific but compatible with extensive chronic small vessel ischemic disease. There is moderate cerebral atrophy. Vascular: Major intracranial vascular flow voids are preserved. Skull and upper cervical spine: Unremarkable bone marrow signal. Sinuses/Orbits: Bilateral cataract extraction. Mild mucosal thickening in the paranasal sinuses. Small left mastoid effusion. Other: None. MRA HEAD FINDINGS The study is moderately motion degraded despite repeat imaging. Anterior circulation: Internal carotid arteries are patent from skull base to carotid termini with  motion artifact limiting assessment for stenosis though with only mild right-sided cavernous stenosis demonstrated on today's CTA. The right A1 segment is absent. The ACAs and MCAs are patent without a gross proximal branch occlusion or high-grade proximal stenosis although assessment is limited by motion artifact. 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 Posterior circulation: Only the distal most V4 segments were included and are patent with the left being dominant. The basilar artery is widely patent. There is a patent left posterior communicating artery. Both PCAs are patent without evidence of a significant proximal stenosis. Short-segment signal loss involving the proximal left P2 segment on the second acquisition is artifactual. No aneurysm is identified. Anatomic variants: Absent right A1. IMPRESSION: 1. No acute intracranial abnormality. 2. Extensive chronic small vessel ischemic disease. 3. Motion degraded head MRA without large vessel occlusion. Electronically Signed   By: Sebastian Ache M.D.   On: 04/23/2021 21:02   MR BRAIN WO CONTRAST  Result Date: 04/23/2021 CLINICAL DATA:  Stroke, follow up. Left-sided weakness and facial droop. EXAM: MRI HEAD WITHOUT CONTRAST MRA HEAD WITHOUT CONTRAST TECHNIQUE: Multiplanar, multi-echo pulse sequences of the brain and surrounding structures were acquired without intravenous contrast. Angiographic images of the Circle of Willis were acquired using MRA technique without intravenous contrast. COMPARISON:  Head CT and CTA 04/23/2021.  Head MRI 10/20/2017. FINDINGS: MRI HEAD FINDINGS The study is intermittently up to moderately motion degraded. Brain: There is no evidence of an acute infarct, intracranial hemorrhage, mass, midline shift, or extra-axial fluid collection. Small chronic infarcts are again noted in the right frontal lobe and right cerebellar hemisphere. T2 hyperintensities elsewhere in the cerebral white matter  bilaterally have mildly progressed from the prior MRI and are nonspecific but compatible with extensive chronic small vessel ischemic disease. There is moderate cerebral atrophy. Vascular: Major intracranial vascular flow voids are preserved. Skull and upper cervical spine: Unremarkable bone marrow signal. Sinuses/Orbits: Bilateral cataract extraction. Mild mucosal thickening in the paranasal sinuses. Small left mastoid effusion. Other: None. MRA HEAD FINDINGS The study is moderately motion degraded despite repeat imaging. Anterior circulation: Internal carotid arteries are patent from skull base to carotid termini with motion artifact limiting assessment for stenosis though with only mild right-sided cavernous stenosis demonstrated on today's CTA. The right A1 segment is absent. The ACAs and MCAs are patent without a gross proximal branch occlusion or high-grade proximal stenosis although assessment is limited by motion artifact. 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 Posterior circulation: Only the distal most V4 segments were included and are patent with the left being dominant. The  basilar artery is widely patent. There is a patent left posterior communicating artery. Both PCAs are patent without evidence of a significant proximal stenosis. Short-segment signal loss involving the proximal left P2 segment on the second acquisition is artifactual. No aneurysm is identified. Anatomic variants: Absent right A1. IMPRESSION: 1. No acute intracranial abnormality. 2. Extensive chronic small vessel ischemic disease. 3. Motion degraded head MRA without large vessel occlusion. Electronically Signed   By: Sebastian AcheAllen  Grady M.D.   On: 04/23/2021 21:02   DG CHEST PORT 1 VIEW  Result Date: 04/25/2021 CLINICAL DATA:  Hypoxia, dyspnea EXAM: PORTABLE CHEST 1 VIEW COMPARISON:  04/23/2021 FINDINGS: Mild left basilar atelectasis. Small left pleural effusion again noted. Lungs are otherwise clear.  No pneumothorax. No pleural effusion on the right. Cardiac size within normal limits. Pulmonary vascularity is normal. IMPRESSION: Stable left basilar atelectasis and small left pleural effusion. Electronically Signed   By: Helyn NumbersAshesh  Parikh M.D.   On: 04/25/2021 00:21   DG CHEST PORT 1 VIEW  Result Date: 04/23/2021 CLINICAL DATA:  COVID. EXAM: PORTABLE CHEST 1 VIEW COMPARISON:  Chest x-ray 02/12/2021. FINDINGS: The aorta is tortuous with atherosclerotic calcifications. The heart is enlarged, unchanged. There are minimal strandy opacities in the left lung base. There is no pleural effusion or pneumothorax identified. No acute fractures are seen. IMPRESSION: 1. Minimal left basilar atelectasis. 2. Stable cardiomegaly. 3.  Aortic Atherosclerosis (ICD10-I70.0). Electronically Signed   By: Darliss CheneyAmy  Guttmann M.D.   On: 04/23/2021 21:30   ECHOCARDIOGRAM COMPLETE  Result Date: 04/24/2021    ECHOCARDIOGRAM REPORT   Patient Name:   Daishaun Simer Date of Exam: 04/24/2021 Medical Rec #:  [id number]  Height:       72.0 in Accession #:    [id number] Weight:       207.2 lb Date of Birth:  08-07-1934  BSA:          2.163 m Patient Age:    87 years   BP:           158/79 mmHg Patient Gender: M          HR:           76 bpm. Exam Location:  Inpatient Procedure: 2D Echo, Color Doppler, Cardiac Doppler, 3D Echo and Intracardiac            Opacification Agent Indications:    Stroke I63.9  History:        Patient has prior history of Echocardiogram examinations, most                 recent 10/16/2017. Signs/Symptoms:dementia; Risk                 Factors:Hypertension, Diabetes and Dyslipidemia.  Sonographer:    Leta Junglingiffany Cooper RDCS Referring Phys: [phone] JARED M GARDNER IMPRESSIONS  1. Left ventricular ejection fraction, by estimation, is 60 to 65%. The left ventricle has normal function. The left ventricle has no regional wall motion abnormalities. There is severe asymmetric left ventricular hypertrophy of the septal segment. Left  ventricular  diastolic parameters are consistent with Grade I diastolic dysfunction (impaired relaxation).  2. Right ventricular systolic function is normal. The right ventricular size is normal. There is normal pulmonary artery systolic pressure. The estimated right ventricular systolic pressure is 31.1 mmHg.  3. The mitral valve is degenerative. Trivial mitral valve regurgitation. No evidence of mitral stenosis.  4. The aortic valve is calcified. There is severe calcifcation of the aortic valve. There is severe thickening of the  aortic valve. Aortic valve regurgitation is moderate. PHT of AI . Aortic valve mean gradient measures 18.0 mmHg. Aortic valve Vmax  measures 2.78 m/s. Although the mean AVG and VMax are consistent with mild AS, the DVI is low at 0.25, calculated AVA 0.79cm2 and SVI are low which likely represents low flow low gradient aortic stenosis with HFpEF of at least moderate degree but possibly moderate to severe.  5. Aortic dilatation noted. There is mild dilatation of the aortic root, measuring 38 mm.  6. The inferior vena cava is normal in size with greater than 50% respiratory variability, suggesting right atrial pressure of 3 mmHg.  7. Consider TEE for further evaluation of aortic valve. FINDINGS  Left Ventricle: Left ventricular ejection fraction, by estimation, is 60 to 65%. The left ventricle has normal function. The left ventricle has no regional wall motion abnormalities. Definity contrast agent was given IV to delineate the left ventricular  endocardial borders. The left ventricular internal cavity size was normal in size. There is severe asymmetric left ventricular hypertrophy of the septal segment. Left ventricular diastolic parameters are consistent with Grade I diastolic dysfunction (impaired relaxation). Normal left ventricular filling pressure. Right Ventricle: The right ventricular size is normal. No increase in right ventricular wall thickness. Right ventricular systolic function is  normal. There is normal pulmonary artery systolic pressure. The tricuspid regurgitant velocity is 2.65 m/s, and  with an assumed right atrial pressure of 3 mmHg, the estimated right ventricular systolic pressure is 31.1 mmHg. Left Atrium: Left atrial size was normal in size. Right Atrium: Right atrial size was normal in size. Pericardium: There is no evidence of pericardial effusion. Mitral Valve: The mitral valve is degenerative in appearance. There is mild calcification of the anterior mitral valve leaflet(s). Trivial mitral valve regurgitation. No evidence of mitral valve stenosis. Tricuspid Valve: The tricuspid valve is normal in structure. Tricuspid valve regurgitation is trivial. No evidence of tricuspid stenosis. Aortic Valve: The aortic valve is calcified. There is severe calcifcation of the aortic valve. There is severe thickening of the aortic valve. Aortic valve regurgitation is moderate. Aortic regurgitation PHT measures 519 msec. Moderate aortic stenosis is  present. Aortic valve mean gradient measures 18.0 mmHg. Aortic valve peak gradient measures 30.9 mmHg. Aortic valve area, by VTI measures 0.79 cm. Pulmonic Valve: The pulmonic valve was normal in structure. Pulmonic valve regurgitation is mild. No evidence of pulmonic stenosis. Aorta: Aortic dilatation noted. There is mild dilatation of the aortic root, measuring 38 mm. Venous: The inferior vena cava is normal in size with greater than 50% respiratory variability, suggesting right atrial pressure of 3 mmHg. IAS/Shunts: No atrial level shunt detected by color flow Doppler.  LEFT VENTRICLE PLAX 2D LVIDd:         4.10 cm  Diastology LVIDs:         2.90 cm  LV e' medial:    5.31 cm/s LV PW:         1.10 cm  LV E/e' medial:  8.6 LV IVS:        1.80 cm  LV e' lateral:   3.73 cm/s LVOT diam:     2.00 cm  LV E/e' lateral: 12.3 LV SV:         42 LV SV Index:   20 LVOT Area:     3.14 cm  RIGHT VENTRICLE RV S prime:     14.60 cm/s TAPSE (M-mode): 2.4 cm LEFT  ATRIUM  Index       RIGHT ATRIUM           Index LA diam:        3.50 cm 1.62 cm/m  RA Area:     16.10 cm LA Vol (A2C):   50.1 ml 23.16 ml/m RA Volume:   40.90 ml  18.91 ml/m LA Vol (A4C):   59.9 ml 27.69 ml/m LA Biplane Vol: 56.0 ml 25.89 ml/m  AORTIC VALVE                    PULMONIC VALVE AV Area (Vmax):    0.75 cm     PR End Diast Vel: 8.18 msec AV Area (Vmean):   0.70 cm AV Area (VTI):     0.79 cm AV Vmax:           278.00 cm/s AV Vmean:          209.000 cm/s AV VTI:            0.535 m AV Peak Grad:      30.9 mmHg AV Mean Grad:      18.0 mmHg LVOT Vmax:         66.60 cm/s LVOT Vmean:        46.400 cm/s LVOT VTI:          0.135 m LVOT/AV VTI ratio: 0.25 AI PHT:            519 msec  AORTA Ao Root diam: 3.80 cm Ao Asc diam:  3.60 cm MITRAL VALVE               TRICUSPID VALVE MV Area (PHT): 3.16 cm    TR Peak grad:   28.1 mmHg MV Decel Time: 240 msec    TR Vmax:        265.00 cm/s MV E velocity: 45.85 cm/s MV A velocity: 61.50 cm/s  SHUNTS MV E/A ratio:  0.75        Systemic VTI:  0.14 m                            Systemic Diam: 2.00 cm Armanda Magic MD Electronically signed by Armanda Magic MD Signature Date/Time: 04/24/2021/11:02:03 AM    Final    CT HEAD CODE STROKE WO CONTRAST  Result Date: 04/23/2021 CLINICAL DATA:  Code stroke. Neuro deficit, acute, stroke suspected. Left facial droop and left arm tingling. EXAM: CT HEAD WITHOUT CONTRAST TECHNIQUE: Contiguous axial images were obtained from the base of the skull through the vertex without intravenous contrast. COMPARISON:  07/10/2020 FINDINGS: Brain: There is no evidence of an acute infarct, intracranial hemorrhage, mass, midline shift, or extra-axial fluid collection. There is mild-to-moderate cerebral atrophy. Patchy and confluent hypodensities in the cerebral white matter bilaterally are unchanged and nonspecific but compatible with extensive chronic small vessel ischemic disease. Small chronic infarcts are again noted in the right  frontal operculum and cerebellum. Vascular: Calcified atherosclerosis at the skull base. No hyperdense vessel. Skull: No fracture or suspicious osseous lesion. Sinuses/Orbits: Mild mucosal thickening in the paranasal sinuses. Clear mastoid air cells. Bilateral cataract extraction. Other: None. ASPECTS [facility] Stroke Program Early CT Score) - Ganglionic level infarction (caudate, lentiform nuclei, internal capsule, insula, M1-M3 cortex): 7 - Supraganglionic infarction (M4-M6 cortex): 3 Total score (0-10 with 10 being normal): 10 IMPRESSION: 1. No evidence of acute intracranial abnormality.  ASPECTS of 10. 2. Extensive chronic small vessel ischemic disease. 3. Small chronic  right frontal and cerebellar infarcts. These results were communicated to Dr. Otelia Limes at 5:16 pm on 04/23/2021 by text page via the [facility] messaging system. Electronically Signed   By: Sebastian Ache M.D.   On: 04/23/2021 17:16   VAS US CAROTID (at [facility] and [facility] only)  Result Date: 04/26/2021 Carotid Arterial Duplex Study Patient Name:  Kirtis Louth  Date of Exam:   04/24/2021 Medical Rec #: [id number]   Accession #:    [id number] Date of Birth: 1933/09/26   Patient Gender: M Patient Age:   45 years Exam Location:  [facility] Procedure:      VAS US CAROTID Referring Phys: Lyda Perone --------------------------------------------------------------------------------  Indications:      CVA. Risk Factors:     Hypertension, hyperlipidemia, Diabetes, coronary artery                   disease. Comparison Study: no prior Performing Technologist: Argentina Ponder RVS  Examination Guidelines: A complete evaluation includes B-mode imaging, spectral Doppler, color Doppler, and power Doppler as needed of all accessible portions of each vessel. Bilateral testing is considered an integral part of a complete examination. Limited examinations for reoccurring indications may be performed as noted.  Right Carotid Findings:  +----------+--------+--------+--------+--------------------+-------------------+           PSV cm/sEDV cm/sStenosisPlaque Description  Comments            +----------+--------+--------+--------+--------------------+-------------------+ CCA Prox  65      10              heterogenous                            +----------+--------+--------+--------+--------------------+-------------------+ CCA Distal65      9               heterogenous and                                                          calcific                                +----------+--------+--------+--------+--------------------+-------------------+ ICA Prox  92      16      1-39%   heterogenous        Velocities may                                                            underestimate                                                             degree of stenosis  due to more                                                               proximal                                                                  obstruction.        +----------+--------+--------+--------+--------------------+-------------------+ ICA Distal55      17                                                      +----------+--------+--------+--------+--------------------+-------------------+ ECA       62      7                                                       +----------+--------+--------+--------+--------------------+-------------------+ +----------+--------+-------+--------+-------------------+           PSV cm/sEDV cmsDescribeArm Pressure (mmHG) +----------+--------+-------+--------+-------------------+ [id number]                                         +----------+--------+-------+--------+-------------------+ +---------+--------+--+--------+--+---------+ VertebralPSV cm/s32EDV cm/s10Antegrade  +---------+--------+--+--------+--+---------+  Left Carotid Findings: +----------+--------+--------+--------+------------------+--------+           PSV cm/sEDV cm/sStenosisPlaque DescriptionComments +----------+--------+--------+--------+------------------+--------+ CCA Prox  80                      heterogenous               +----------+--------+--------+--------+------------------+--------+ CCA Distal59      13              heterogenous               +----------+--------+--------+--------+------------------+--------+ ICA Prox  58      16      1-39%   heterogenous               +----------+--------+--------+--------+------------------+--------+ ICA Distal65      19                                         +----------+--------+--------+--------+------------------+--------+ ECA       68      11                                         +----------+--------+--------+--------+------------------+--------+ +----------+--------+--------+--------+-------------------+           PSV cm/sEDV cm/sDescribeArm Pressure (mmHG) +----------+--------+--------+--------+-------------------+ Subclavian105                                         +----------+--------+--------+--------+-------------------+ +---------+--------+---+--------+--+---------+  VertebralPSV cm/s101EDV cm/s18Antegrade +---------+--------+---+--------+--+---------+   Summary: Right Carotid: Velocities in the right ICA are consistent with a 1-39% stenosis. Left Carotid: Velocities in the left ICA are consistent with a 1-39% stenosis. Vertebrals: Bilateral vertebral arteries demonstrate antegrade flow. *See table(s) above for measurements and observations.  Electronically signed by Marvel Plan MD on 04/26/2021 at 8:29:05 AM.    Final    CT ANGIO HEAD NECK W WO CM W PERF (CODE STROKE)  Result Date: 04/23/2021 CLINICAL DATA:  Neuro deficit, acute, stroke suspected. Left facial droop and left arm tingling. EXAM:  CT ANGIOGRAPHY HEAD AND NECK CT PERFUSION BRAIN TECHNIQUE: Multidetector CT imaging of the head and neck was performed using the standard protocol during bolus administration of intravenous contrast. Multiplanar CT image reconstructions and MIPs were obtained to evaluate the vascular anatomy. Carotid stenosis measurements (when applicable) are obtained utilizing NASCET criteria, using the distal internal carotid diameter as the denominator. Multiphase CT imaging of the brain was performed following IV bolus contrast injection. Subsequent parametric perfusion maps were calculated using RAPID software. CONTRAST:  OMNIPAQUE IOHEXOL 350 MG/ML SOLN COMPARISON:  None. FINDINGS: CTA NECK FINDINGS Aortic arch: Standard 3 vessel aortic arch with a moderate amount of calcified plaque. Proximally 50% stenosis of the origin of the brachiocephalic artery. Right carotid system: Patent with heavily calcified plaque at the carotid bifurcation resulting in high-grade stenosis of the origin of the ICA with quantification limited as the densely calcified plaque obscures the small residual patent lumen. Left carotid system: Patent with a moderate amount of calcified plaque at the carotid bifurcation. No evidence of a significant stenosis or dissection. Vertebral arteries: The left vertebral artery is patent and strongly dominant without evidence of a significant stenosis or dissection. The right vertebral artery is patent and small with assessment of the V1 segment limited by venous contrast. There is calcified plaque at the origin of the right vertebral artery with assessment of a suspected associated stenosis limited due to poor visualization of the lumen due to the small size of the artery and density of the calcified plaque. Skeleton: Focally advanced disc degeneration at C5-6. Mild cervical facet arthrosis. Other neck: No evidence of cervical lymphadenopathy or mass. Upper chest: No apical lung consolidation or mass. Review  of the MIP images confirms the above findings CTA HEAD FINDINGS Anterior circulation: The internal carotid arteries are patent from skull base to carotid termini with calcified plaque resulting in mild cavernous stenosis on the right. ACAs and MCAs are patent without evidence of a proximal branch occlusion or significant proximal stenosis. There is a 2 mm aneurysm projecting laterally from the left MCA bifurcation. Posterior circulation: The intracranial vertebral arteries are patent to the basilar with calcified plaque resulting in moderate left V4 stenosis. The right V4 segment is hypoplastic and diffusely irregular. The basilar artery is widely patent. There is likely a small left posterior communicating artery. Both PCAs are patent without evidence of a significant proximal stenosis. No aneurysm is identified. Venous sinuses: As permitted by contrast timing, patent. Anatomic variants: Absent right A1 segment. Review of the MIP images confirms the above findings CT Brain Perfusion Findings: The study is motion degraded. ASPECTS: 10 CBF (<30%) Volume: 0 mL Perfusion (Tmax>6.0s) volume: 14 mL Mismatch Volume: 14 mL Infarction Location: No core infarct is identified by rapid processing. The small areas of prolonged Tmax are located in the right aspect of the posterior fossa, right occipital region, and high right frontal region and may be artifactual given involvement of  multiple vascular territories and motion. IMPRESSION: 1. No emergent large vessel occlusion. 2. 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 3. Mild intracranial atherosclerosis including mild right cavernous ICA and left V4 stenoses. 4. 2 mm left MCA bifurcation aneurysm. 5. Motion degraded CTP without evidence of an acute core infarct. Small foci of possible penumbra may be artifactual. 6.  Aortic Atherosclerosis (ICD10-I70.0). Emergent results were communicated to  Dr. Otelia Limes at 5:39 pm on 04/23/2021 by text page via the [facility] messaging system. Electronically Signed   By: Sebastian Ache M.D.   On: 04/23/2021 17:54     Subjective: He is alert, follows demonstration command. I use sign [employer] broker . He was able to say ok when we told him he could go back home today.  No dyspnea.   Discharge Exam: Vitals:   04/27/21 0721 04/27/21 0732  BP: (!) 153/75   Pulse: 67   Resp: 14 19  Temp: 97.7 F (36.5 C)   SpO2: 95%      General: Pt is alert, awake, not in acute distress Cardiovascular: RRR, S1/S2 +, no rubs, no gallops Respiratory: CTA bilaterally, no wheezing, no rhonchi Abdominal: Soft, NT, ND, bowel sounds + Extremities: no edema, no cyanosis    The results of significant diagnostics from this hospitalization (including imaging, microbiology, ancillary and laboratory) are listed below for reference.     Microbiology: Recent Results (from the past 240 hour(s))  Resp Panel by RT-PCR (Flu A&B, Covid) Nasopharyngeal Swab     Status: Abnormal   Collection Time: 04/23/21  6:31 PM   Specimen: Nasopharyngeal Swab; Nasopharyngeal(NP) swabs in vial transport medium  Result Value Ref Range Status   SARS Coronavirus 2 by RT PCR POSITIVE (A) NEGATIVE Final    Comment: RESULT CALLED TO, READ BACK BY AND VERIFIED WITH: DACIA HARRIS 04/23/2021 @2036  BY JW (NOTE) SARS-CoV-2 target nucleic acids are DETECTED.  The SARS-CoV-2 RNA is generally detectable in upper respiratory specimens during the acute phase of infection. Positive results are indicative of the presence of the identified virus, but do not rule out bacterial infection or co-infection with other pathogens not detected by the test. Clinical correlation with patient history and other diagnostic information is necessary to determine patient infection status. The expected result is Negative.  Fact Sheet for Patients:  Fact Sheet for Healthcare  Providers: BloggerCourse.com  This test is not yet approved or cleared by the [place] FDA and  has been authorized for detection and/or diagnosis of SARS-CoV-2 by FDA under an Emergency Use Authorization (EUA).  This EUA will remain in effect (meaning this test can be  used) for the duration of  the COVID-19 declaration under Section 564(b)(1) of the Act, 21 U.S.C. section 360bbb-3(b)(1), unless the authorization is terminated or revoked sooner.     Influenza A by PCR NEGATIVE NEGATIVE Final   Influenza B by PCR NEGATIVE NEGATIVE Final    Comment: (NOTE) The Xpert Xpress SARS-CoV-2/FLU/RSV plus assay is intended as an aid in the diagnosis of influenza from Nasopharyngeal swab specimens and should not be used as a sole basis for treatment. Nasal washings and aspirates are unacceptable for Xpert Xpress SARS-CoV-2/FLU/RSV testing.  Fact Sheet for Patients: [place]  Fact Sheet for Healthcare Providers: BloggerCourse.com  This test is not yet approved or cleared by the [place] FDA and has been authorized for detection and/or diagnosis of SARS-CoV-2 by FDA under an Emergency Use  Authorization (EUA). This EUA will remain in effect (meaning this test can be used) for the duration of the COVID-19 declaration under Section 564(b)(1) of the Act, 21 U.S.C. section 360bbb-3(b)(1), unless the authorization is terminated or revoked.  Performed at [facility], [address], [place], [name] [address]      Labs: BNP (last 3 results) No results for input(s): BNP in the last 8760 hours. Basic Metabolic Panel: Recent Labs  Lab 04/23/21 1649 04/23/21 1659 04/25/21 0612 04/26/21 0801  NA 138 140 137 135  K 4.7 4.3 4.2 3.9  CL 105 104 104 103  CO2 26  --  25 26  GLUCOSE 132* 128* 130* 126*  BUN 22 26* 21 31*  CREATININE 1.32* 1.30* 1.21 1.23  CALCIUM 8.8*  --  8.9 8.2*  MG  --    --   --  2.1   Liver Function Tests: Recent Labs  Lab 04/23/21 1649  AST 34  ALT 18  ALKPHOS 51  BILITOT 1.3*  PROT 6.1*  ALBUMIN 3.6   No results for input(s): LIPASE, AMYLASE in the last 168 hours. No results for input(s): AMMONIA in the last 168 hours. CBC: Recent Labs  Lab 04/23/21 1649 04/23/21 1659 04/25/21 0612 04/26/21 0801  WBC 8.1  --  10.5 8.6  NEUTROABS 6.1  --   --   --   HGB 16.3 16.3 16.3 15.1  HCT 49.8 48.0 49.2 44.8  MCV 94.5  --  92.0 92.4  PLT 309  --  332 310   Cardiac Enzymes: No results for input(s): CKTOTAL, CKMB, CKMBINDEX, TROPONINI in the last 168 hours. BNP: Invalid input(s): POCBNP CBG: Recent Labs  Lab 04/26/21 0828 04/26/21 1224 04/26/21 1600 04/26/21 2026 04/27/21 0008  GLUCAP 116* 97 111* 176* 193*   D-Dimer No results for input(s): DDIMER in the last 72 hours. Hgb A1c No results for input(s): HGBA1C in the last 72 hours. Lipid Profile No results for input(s): CHOL, HDL, LDLCALC, TRIG, CHOLHDL, LDLDIRECT in the last 72 hours. Thyroid function studies No results for input(s): TSH, T4TOTAL, T3FREE, THYROIDAB in the last 72 hours.  Invalid input(s): FREET3 Anemia work up No results for input(s): VITAMINB12, FOLATE, FERRITIN, TIBC, IRON, RETICCTPCT in the last 72 hours. Urinalysis    Component Value Date/Time   COLORURINE YELLOW 04/24/2021 0203   APPEARANCEUR CLEAR 04/24/2021 0203   LABSPEC 1.026 04/24/2021 0203   PHURINE 6.0 04/24/2021 0203   GLUCOSEU NEGATIVE 04/24/2021 0203   HGBUR NEGATIVE 04/24/2021 0203   BILIRUBINUR NEGATIVE 04/24/2021 0203   KETONESUR 5 (A) 04/24/2021 0203   PROTEINUR NEGATIVE 04/24/2021 0203   NITRITE NEGATIVE 04/24/2021 0203   LEUKOCYTESUR NEGATIVE 04/24/2021 0203   Sepsis Labs Invalid input(s): PROCALCITONIN,  WBC,  LACTICIDVEN Microbiology Recent Results (from the past 240 hour(s))  Resp Panel by RT-PCR (Flu A&B, Covid) Nasopharyngeal Swab     Status: Abnormal   Collection Time:  04/23/21  6:31 PM   Specimen: Nasopharyngeal Swab; Nasopharyngeal(NP) swabs in vial transport medium  Result Value Ref Range Status   SARS Coronavirus 2 by RT PCR POSITIVE (A) NEGATIVE Final    Comment: RESULT CALLED TO, READ BACK BY AND VERIFIED WITH: DACIA HARRIS 04/23/2021  BY JW (NOTE) SARS-CoV-2 target nucleic acids are DETECTED.  The SARS-CoV-2 RNA is generally detectable in upper respiratory specimens during the acute phase of infection. Positive results are indicative of the presence of the identified virus, but do not rule out bacterial infection or co-infection with other  pathogens not detected by the test. Clinical correlation with patient history and other diagnostic information is necessary to determine patient infection status. The expected result is Negative.  Fact Sheet for Patients: BloggerCourse.com  Fact Sheet for Healthcare Providers: [employer]  This test is not yet approved or cleared by the [place] FDA and  has been authorized for detection and/or diagnosis of SARS-CoV-2 by FDA under an Emergency Use Authorization (EUA).  This EUA will remain in effect (meaning this test can be  used) for the duration of  the COVID-19 declaration under Section 564(b)(1) of the Act, 21 U.S.C. section 360bbb-3(b)(1), unless the authorization is terminated or revoked sooner.     Influenza A by PCR NEGATIVE NEGATIVE Final   Influenza B by PCR NEGATIVE NEGATIVE Final    Comment: (NOTE) The Xpert Xpress SARS-CoV-2/FLU/RSV plus assay is intended as an aid in the diagnosis of influenza from Nasopharyngeal swab specimens and should not be used as a sole basis for treatment. Nasal washings and aspirates are unacceptable for Xpert Xpress SARS-CoV-2/FLU/RSV testing.  Fact Sheet for Patients: BloggerCourse.com  Fact Sheet for Healthcare  Providers: [employer]it  This test is not yet approved or cleared by the [place] FDA and has been authorized for detection and/or diagnosis of SARS-CoV-2 by FDA under an Emergency Use Authorization (EUA). This EUA will remain in effect (meaning this test can be used) for the duration of the COVID-19 declaration under Section 564(b)(1) of the Act, 21 U.S.C. section 360bbb-3(b)(1), unless the authorization is terminated or revoked.  Performed at [facility], [address], [place], [name] [address]      Time coordinating discharge: 40 minutes  SIGNED:   Alba Cory, MD  [facility]

## 2021-04-28 DIAGNOSIS — G459 Transient cerebral ischemic attack, unspecified: Secondary | ICD-10-CM | POA: Diagnosis not present

## 2021-04-28 LAB — GLUCOSE, CAPILLARY
Glucose-Capillary: 124 mg/dL — ABNORMAL HIGH (ref 70–99)
Glucose-Capillary: 103 mg/dL — ABNORMAL HIGH (ref 70–99)
Glucose-Capillary: 127 mg/dL — ABNORMAL HIGH (ref 70–99)

## 2021-04-28 MED ORDER — DEXAMETHASONE 4 MG PO TABS
6.0000 mg | ORAL_TABLET | Freq: Every day | ORAL | Status: DC
  Administered 2021-04-28: 6 mg via ORAL
  Filled 2021-04-28: qty 1

## 2021-04-28 NOTE — Progress Notes (Signed)
Report called to [place], Med Tech at [employer]. All questions answered.

## 2021-04-28 NOTE — Discharge Summary (Signed)
Physician Discharge Summary  Kratos Ruscitti ZOX:[id number] DOB: 02/20/1934 DOA: 04/23/2021  PCP: Jolene Provost, MD  Admit date: 04/23/2021 Discharge date: 04/28/2021  Admitted From: ALF Disposition:  ALF  Recommendations for Outpatient Follow-up:  Follow up with PCP in 1-2 weeks Please obtain BMP/CBC in one week.  Needs to follow up with Neurology after TIA.  He will need aspirin and plavix and statins for medical therapy of Carotid artery stenosis.     Discharge Condition: Stable.  CODE STATUS: DNR Diet recommendation: Dysphagia 2 diet  Brief/Interim Summary: 87 year old with past medical history significant for deafness, CAD, diabetes type 2, hypertension, hyperlipidemia, dementia.  Per HPI daughter reporter patient has advancing dementia.  Only person who can communicate with him routinely is his son-in-law who has to be present in person.  Patient used to do okay with ASL interpreter over a monitor but now does not really do well with video interpreters anymore.  Now days answer most questions even to son-in-law as yes and no.  Presented from a skilled nursing facility after he was noticed to have left arm weakness and left-sided facial droop.  Report that the patient had left arm tingling as well.   Work-up in the ED: MRI negative for acute stroke, CTA does show high-grade right ICA origin stenosis.  Received Ativan for MRI.  Patient was found to have to be positive for COVID.  1-TIA: Patient noticed to have left facial weakness, left arm weakness. MRI negative for acute stroke. CTA: Showed high-grade stenosis of the right ICA at its origin with quantification limited by heavily calcified nature of the plaque. Neurology recommended vascular surgery evaluation. Patient was a started on Plavix. Stable, following commands.  Plan to continue with aspirin and plavix. he will need aspirin and Plavix as well for medical treatment carotid artery diseases.    2-Acute metabolic encephalopathy,  on top of underlying dementia: Patient is sleepy and lethargic this morning could be related to Ativan. Component of delirium secondary to COVID He is alert, follows demonstration command.    3-PNA COVID-19: Acute Hypoxic respiratory Failure;  -Desat last night to 83 RA (8/08), he required suction.  -Continue with Remdesivir, day 3/4.  -Hypoxic overnight required NB mask 15 L. Repeated chest x ray; Stable basilar atelectasis, left pleural effusion. He was suction with good respond.  -Started on IV Dexamethasone. Plan to discharge on 3 more days of dexamethasone.  -He was wean down to 2 L oxygen. will access home oxygen needs.    High-grade stenosis of the right ICA: Vascular consulted. They will review doppler.  Symptomatic.  No plan for surgical intervention, carotid doppler no more than 50% stenosis.  Patient Will need dual antiplatelet therapy aspirin and plavix and statins indefinitely/    DM type 2 On diet  Continue  sliding scale insulin   HTN; Holding Norvasc and resume lisinopril.  Dementia; hold namenda due to bradycardia.  Risperidone on hold on admission due to AMS,. He has not required risperidone.  HLD; Continue with the statins Hypothyroidism: Continue with Synthroid.  Bradycardia; Asymptomatic. HR low while sleep and while he was getting Remdesivir.  Remdesivir discontinue. Subsequently HR increase to 50.  HR stable above 50 on telemetry. Asymptomatic.    He is stable for discharge.   Discharge Diagnoses:  Principal Problem:   TIA (transient ischemic attack) Active Problems:   COVID-19 virus infection   HTN (hypertension)   HLD (hyperlipidemia)   DM2 (diabetes mellitus, type 2) (HCC)   Acute metabolic  encephalopathy   Acute ischemic stroke [facility])    Discharge Instructions  Discharge Instructions     Ambulatory referral to Neurology   Complete by: As directed    Follow up with Dr. Marjory Lies at [facility] in 4 weeks.  Patient is Dr. Richrd Humbles patient. Thanks.    Diet - low sodium heart healthy   Complete by: As directed    Increase activity slowly   Complete by: As directed       Allergies as of 04/28/2021   No Known Allergies      Medication List     STOP taking these medications    amLODipine 5 MG tablet Commonly known as: NORVASC   memantine 10 MG tablet Commonly known as: NAMENDA   risperiDONE 0.25 MG tablet Commonly known as: RISPERDAL       TAKE these medications    aspirin EC 81 MG tablet Take 81 mg by mouth daily. Swallow whole.   atorvastatin 40 MG tablet Commonly known as: LIPITOR Take 40 mg by mouth at bedtime.   clopidogrel 75 MG tablet Commonly known as: PLAVIX Take 1 tablet (75 mg total) by mouth daily.   dexamethasone 6 MG tablet Commonly known as: DECADRON Take 1 tablet (6 mg total) by mouth daily.   divalproex 125 MG capsule Commonly known as: DEPAKOTE SPRINKLE Take 375 mg by mouth 2 (two) times daily.   guaiFENesin-dextromethorphan 100-10 MG/5ML syrup Commonly known as: ROBITUSSIN DM Take 5 mLs by mouth every 4 (four) hours as needed for cough.   levothyroxine 75 MCG tablet Commonly known as: SYNTHROID Take 75 mcg by mouth daily before breakfast.   lisinopril 40 MG tablet Commonly known as: ZESTRIL Take 40 mg by mouth daily.   rOPINIRole 0.5 MG tablet Commonly known as: REQUIP Take 0.5 mg by mouth at bedtime.   Vitamin D3 25 MCG (1000 UT) Caps Take 1,000 Units by mouth daily.               Durable Medical Equipment  (From admission, onward)           Start     Ordered   04/27/21 0734  For home use only DME oxygen  Once       Question Answer Comment  Length of Need 6 Months   Mode or (Route) Nasal cannula   Liters per Minute 2   Frequency Continuous (stationary and portable oxygen unit needed)   Oxygen delivery system Gas      04/27/21 0733            Follow-up Information     Penumalli, Glenford Bayley, MD. Schedule an appointment as soon as possible for a  visit in 1 month(s).   Specialties: Neurology, Radiology Contact information: [address] [name] [address] [phone]         Care, [facility] Follow up.   Specialty: Home Health Services Why: Representative from [place] will contact you to schedule start of home health services. Contact information: [address] [name] [address] [phone]                No Known Allergies  Consultations: Neurology Vascular.    Procedures/Studies: MR ANGIO HEAD WO CONTRAST  Result Date: 04/23/2021 CLINICAL DATA:  Stroke, follow up. Left-sided weakness and facial droop. EXAM: MRI HEAD WITHOUT CONTRAST MRA HEAD WITHOUT CONTRAST TECHNIQUE: Multiplanar, multi-echo pulse sequences of the brain and surrounding structures were acquired without intravenous contrast. Angiographic images of the Circle  of Willis were acquired using MRA technique without intravenous contrast. COMPARISON:  Head CT and CTA 04/23/2021.  Head MRI 10/20/2017. FINDINGS: MRI HEAD FINDINGS The study is intermittently up to moderately motion degraded. Brain: There is no evidence of an acute infarct, intracranial hemorrhage, mass, midline shift, or extra-axial fluid collection. Small chronic infarcts are again noted in the right frontal lobe and right cerebellar hemisphere. T2 hyperintensities elsewhere in the cerebral white matter bilaterally have mildly progressed from the prior MRI and are nonspecific but compatible with extensive chronic small vessel ischemic disease. There is moderate cerebral atrophy. Vascular: Major intracranial vascular flow voids are preserved. Skull and upper cervical spine: Unremarkable bone marrow signal. Sinuses/Orbits: Bilateral cataract extraction. Mild mucosal thickening in the paranasal sinuses. Small left mastoid effusion. Other: None. MRA HEAD FINDINGS The study is moderately motion degraded despite repeat imaging. Anterior circulation: Internal  carotid arteries are patent from skull base to carotid termini with motion artifact limiting assessment for stenosis though with only mild right-sided cavernous stenosis demonstrated on today's CTA. The right A1 segment is absent. The ACAs and MCAs are patent without a gross proximal branch occlusion or high-grade proximal stenosis although assessment is limited by motion artifact. 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 Posterior circulation: Only the distal most V4 segments were included and are patent with the left being dominant. The basilar artery is widely patent. There is a patent left posterior communicating artery. Both PCAs are patent without evidence of a significant proximal stenosis. Short-segment signal loss involving the proximal left P2 segment on the second acquisition is artifactual. No aneurysm is identified. Anatomic variants: Absent right A1. IMPRESSION: 1. No acute intracranial abnormality. 2. Extensive chronic small vessel ischemic disease. 3. Motion degraded head MRA without large vessel occlusion. Electronically Signed   By: Sebastian Ache M.D.   On: 04/23/2021 21:02   MR BRAIN WO CONTRAST  Result Date: 04/23/2021 CLINICAL DATA:  Stroke, follow up. Left-sided weakness and facial droop. EXAM: MRI HEAD WITHOUT CONTRAST MRA HEAD WITHOUT CONTRAST TECHNIQUE: Multiplanar, multi-echo pulse sequences of the brain and surrounding structures were acquired without intravenous contrast. Angiographic images of the Circle of Willis were acquired using MRA technique without intravenous contrast. COMPARISON:  Head CT and CTA 04/23/2021.  Head MRI 10/20/2017. FINDINGS: MRI HEAD FINDINGS The study is intermittently up to moderately motion degraded. Brain: There is no evidence of an acute infarct, intracranial hemorrhage, mass, midline shift, or extra-axial fluid collection. Small chronic infarcts are again noted in the right frontal lobe and right cerebellar hemisphere.  T2 hyperintensities elsewhere in the cerebral white matter bilaterally have mildly progressed from the prior MRI and are nonspecific but compatible with extensive chronic small vessel ischemic disease. There is moderate cerebral atrophy. Vascular: Major intracranial vascular flow voids are preserved. Skull and upper cervical spine: Unremarkable bone marrow signal. Sinuses/Orbits: Bilateral cataract extraction. Mild mucosal thickening in the paranasal sinuses. Small left mastoid effusion. Other: None. MRA HEAD FINDINGS The study is moderately motion degraded despite repeat imaging. Anterior circulation: Internal carotid arteries are patent from skull base to carotid termini with motion artifact limiting assessment for stenosis though with only mild right-sided cavernous stenosis demonstrated on today's CTA. The right A1 segment is absent. The ACAs and MCAs are patent without a gross proximal branch occlusion or high-grade proximal stenosis although assessment is limited by motion artifact. 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 Posterior circulation: Only the distal most  V4 segments were included and are patent with the left being dominant. The basilar artery is widely patent. There is a patent left posterior communicating artery. Both PCAs are patent without evidence of a significant proximal stenosis. Short-segment signal loss involving the proximal left P2 segment on the second acquisition is artifactual. No aneurysm is identified. Anatomic variants: Absent right A1. IMPRESSION: 1. No acute intracranial abnormality. 2. Extensive chronic small vessel ischemic disease. 3. Motion degraded head MRA without large vessel occlusion. Electronically Signed   By: Sebastian Ache M.D.   On: 04/23/2021 21:02   DG CHEST PORT 1 VIEW  Result Date: 04/25/2021 CLINICAL DATA:  Hypoxia, dyspnea EXAM: PORTABLE CHEST 1 VIEW COMPARISON:  04/23/2021 FINDINGS: Mild left basilar atelectasis. Small  left pleural effusion again noted. Lungs are otherwise clear. No pneumothorax. No pleural effusion on the right. Cardiac size within normal limits. Pulmonary vascularity is normal. IMPRESSION: Stable left basilar atelectasis and small left pleural effusion. Electronically Signed   By: Helyn Numbers M.D.   On: 04/25/2021 00:21   DG CHEST PORT 1 VIEW  Result Date: 04/23/2021 CLINICAL DATA:  COVID. EXAM: PORTABLE CHEST 1 VIEW COMPARISON:  Chest x-ray 02/12/2021. FINDINGS: The aorta is tortuous with atherosclerotic calcifications. The heart is enlarged, unchanged. There are minimal strandy opacities in the left lung base. There is no pleural effusion or pneumothorax identified. No acute fractures are seen. IMPRESSION: 1. Minimal left basilar atelectasis. 2. Stable cardiomegaly. 3.  Aortic Atherosclerosis (ICD10-I70.0). Electronically Signed   By: Darliss Cheney M.D.   On: 04/23/2021 21:30   ECHOCARDIOGRAM COMPLETE  Result Date: 04/24/2021    ECHOCARDIOGRAM REPORT   Patient Name:   Carder Beets Date of Exam: 04/24/2021 Medical Rec #:  [id number]  Height:       72.0 in Accession #:    [id number] Weight:       207.2 lb Date of Birth:  [birth year]  BSA:          2.163 m Patient Age:    87 years   BP:           158/79 mmHg Patient Gender: M          HR:           76 bpm. Exam Location:  Inpatient Procedure: 2D Echo, Color Doppler, Cardiac Doppler, 3D Echo and Intracardiac            Opacification Agent Indications:    Stroke I63.9  History:        Patient has prior history of Echocardiogram examinations, most                 recent 10/16/2017. Signs/Symptoms:dementia; Risk                 Factors:Hypertension, Diabetes and Dyslipidemia.  Sonographer:    [name] RDCS Referring Phys: [phone] JARED M GARDNER IMPRESSIONS  1. Left ventricular ejection fraction, by estimation, is 60 to 65%. The left ventricle has normal function. The left ventricle has no regional wall motion abnormalities. There is severe asymmetric left  ventricular hypertrophy of the septal segment. Left  ventricular diastolic parameters are consistent with Grade I diastolic dysfunction (impaired relaxation).  2. Right ventricular systolic function is normal. The right ventricular size is normal. There is normal pulmonary artery systolic pressure. The estimated right ventricular systolic pressure is 31.1 mmHg.  3. The mitral valve is degenerative. Trivial mitral valve regurgitation. No evidence of mitral stenosis.  4. The aortic valve is calcified. There  is severe calcifcation of the aortic valve. There is severe thickening of the aortic valve. Aortic valve regurgitation is moderate. PHT of AI . Aortic valve mean gradient measures 18.0 mmHg. Aortic valve Vmax  measures 2.78 m/s. Although the mean AVG and VMax are consistent with mild AS, the DVI is low at 0.25, calculated AVA 0.79cm2 and SVI are low which likely represents low flow low gradient aortic stenosis with HFpEF of at least moderate degree but possibly moderate to severe.  5. Aortic dilatation noted. There is mild dilatation of the aortic root, measuring 38 mm.  6. The inferior vena cava is normal in size with greater than 50% respiratory variability, suggesting right atrial pressure of 3 mmHg.  7. Consider TEE for further evaluation of aortic valve. FINDINGS  Left Ventricle: Left ventricular ejection fraction, by estimation, is 60 to 65%. The left ventricle has normal function. The left ventricle has no regional wall motion abnormalities. Definity contrast agent was given IV to delineate the left ventricular  endocardial borders. The left ventricular internal cavity size was normal in size. There is severe asymmetric left ventricular hypertrophy of the septal segment. Left ventricular diastolic parameters are consistent with Grade I diastolic dysfunction (impaired relaxation). Normal left ventricular filling pressure. Right Ventricle: The right ventricular size is normal. No increase in right  ventricular wall thickness. Right ventricular systolic function is normal. There is normal pulmonary artery systolic pressure. The tricuspid regurgitant velocity is 2.65 m/s, and  with an assumed right atrial pressure of 3 mmHg, the estimated right ventricular systolic pressure is 31.1 mmHg. Left Atrium: Left atrial size was normal in size. Right Atrium: Right atrial size was normal in size. Pericardium: There is no evidence of pericardial effusion. Mitral Valve: The mitral valve is degenerative in appearance. There is mild calcification of the anterior mitral valve leaflet(s). Trivial mitral valve regurgitation. No evidence of mitral valve stenosis. Tricuspid Valve: The tricuspid valve is normal in structure. Tricuspid valve regurgitation is trivial. No evidence of tricuspid stenosis. Aortic Valve: The aortic valve is calcified. There is severe calcifcation of the aortic valve. There is severe thickening of the aortic valve. Aortic valve regurgitation is moderate. Aortic regurgitation PHT measures 519 msec. Moderate aortic stenosis is  present. Aortic valve mean gradient measures 18.0 mmHg. Aortic valve peak gradient measures 30.9 mmHg. Aortic valve area, by VTI measures 0.79 cm. Pulmonic Valve: The pulmonic valve was normal in structure. Pulmonic valve regurgitation is mild. No evidence of pulmonic stenosis. Aorta: Aortic dilatation noted. There is mild dilatation of the aortic root, measuring 38 mm. Venous: The inferior vena cava is normal in size with greater than 50% respiratory variability, suggesting right atrial pressure of 3 mmHg. IAS/Shunts: No atrial level shunt detected by color flow Doppler.  LEFT VENTRICLE PLAX 2D LVIDd:         4.10 cm  Diastology LVIDs:         2.90 cm  LV e' medial:    5.31 cm/s LV PW:         1.10 cm  LV E/e' medial:  8.6 LV IVS:        1.80 cm  LV e' lateral:   3.73 cm/s LVOT diam:     2.00 cm  LV E/e' lateral: 12.3 LV SV:         42 LV SV Index:   20 LVOT Area:     3.14 cm   RIGHT VENTRICLE RV S prime:     14.60 cm/s TAPSE (M-mode):  2.4 cm LEFT ATRIUM             Index       RIGHT ATRIUM           Index LA diam:        3.50 cm 1.62 cm/m  RA Area:     16.10 cm LA Vol (A2C):   50.1 ml 23.16 ml/m RA Volume:   40.90 ml  18.91 ml/m LA Vol (A4C):   59.9 ml 27.69 ml/m LA Biplane Vol: 56.0 ml 25.89 ml/m  AORTIC VALVE                    PULMONIC VALVE AV Area (Vmax):    0.75 cm     PR End Diast Vel: 8.18 msec AV Area (Vmean):   0.70 cm AV Area (VTI):     0.79 cm AV Vmax:           278.00 cm/s AV Vmean:          209.000 cm/s AV VTI:            0.535 m AV Peak Grad:      30.9 mmHg AV Mean Grad:      18.0 mmHg LVOT Vmax:         66.60 cm/s LVOT Vmean:        46.400 cm/s LVOT VTI:          0.135 m LVOT/AV VTI ratio: 0.25 AI PHT:            519 msec  AORTA Ao Root diam: 3.80 cm Ao Asc diam:  3.60 cm MITRAL VALVE               TRICUSPID VALVE MV Area (PHT): 3.16 cm    TR Peak grad:   28.1 mmHg MV Decel Time: 240 msec    TR Vmax:        265.00 cm/s MV E velocity: 45.85 cm/s MV A velocity: 61.50 cm/s  SHUNTS MV E/A ratio:  0.75        Systemic VTI:  0.14 m                            Systemic Diam: 2.00 cm Armanda Magicraci Turner MD Electronically signed by Armanda Magicraci Turner MD Signature Date/Time: 04/24/2021/11:02:03 AM    Final    CT HEAD CODE STROKE WO CONTRAST  Result Date: 04/23/2021 CLINICAL DATA:  Code stroke. Neuro deficit, acute, stroke suspected. Left facial droop and left arm tingling. EXAM: CT HEAD WITHOUT CONTRAST TECHNIQUE: Contiguous axial images were obtained from the base of the skull through the vertex without intravenous contrast. COMPARISON:  07/10/2020 FINDINGS: Brain: There is no evidence of an acute infarct, intracranial hemorrhage, mass, midline shift, or extra-axial fluid collection. There is mild-to-moderate cerebral atrophy. Patchy and confluent hypodensities in the cerebral white matter bilaterally are unchanged and nonspecific but compatible with extensive chronic small vessel  ischemic disease. Small chronic infarcts are again noted in the right frontal operculum and cerebellum. Vascular: Calcified atherosclerosis at the skull base. No hyperdense vessel. Skull: No fracture or suspicious osseous lesion. Sinuses/Orbits: Mild mucosal thickening in the paranasal sinuses. Clear mastoid air cells. Bilateral cataract extraction. Other: None. ASPECTS [facility]([place] Stroke Program Early CT Score) - Ganglionic level infarction (caudate, lentiform nuclei, internal capsule, insula, M1-M3 cortex): 7 - Supraganglionic infarction (M4-M6 cortex): 3 Total score (0-10 with 10 being normal): 10 IMPRESSION: 1. No evidence of acute  intracranial abnormality.  ASPECTS of 10. 2. Extensive chronic small vessel ischemic disease. 3. 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 These results were communicated to Dr. Otelia Limes at 5:16 pm on 04/23/2021 by text page via the [facility] messaging system. Electronically Signed   By: Sebastian Ache M.D.   On: 04/23/2021 17:16   VAS US CAROTID (at [facility] and [facility] only)  Result Date: 04/26/2021 Carotid Arterial Duplex Study Patient Name:  Volney Pupo  Date of Exam:   04/24/2021 Medical Rec #: [id number]   Accession #:    [id number] Date of Birth: [birth year]   Patient Gender: M Patient Age:   77 years Exam Location:  [facility] Procedure:      VAS US CAROTID Referring Phys: Lyda Perone --------------------------------------------------------------------------------  Indications:      CVA. Risk Factors:     Hypertension, hyperlipidemia, Diabetes, coronary artery                   disease. Comparison Study: no prior Performing Technologist: Argentina Ponder RVS  Examination Guidelines: A complete evaluation includes B-mode imaging, spectral Doppler, color Doppler, and power Doppler as needed of all accessible portions of each vessel. Bilateral testing is considered an integral part of a complete examination. Limited examinations for reoccurring indications may be performed as noted.   Right Carotid Findings: +----------+--------+--------+--------+--------------------+-------------------+           PSV cm/sEDV cm/sStenosisPlaque Description  Comments            +----------+--------+--------+--------+--------------------+-------------------+ CCA Prox  65      10              heterogenous                            +----------+--------+--------+--------+--------------------+-------------------+ CCA Distal65      9               heterogenous and                                                          calcific                                +----------+--------+--------+--------+--------------------+-------------------+ ICA Prox  92      16      1-39%   heterogenous        Velocities may                                                            underestimate                                                             degree of stenosis  due to more                                                               proximal                                                                  obstruction.        +----------+--------+--------+--------+--------------------+-------------------+ ICA Distal55      17                                                      +----------+--------+--------+--------+--------------------+-------------------+ ECA       62      7                                                       +----------+--------+--------+--------+--------------------+-------------------+ +----------+--------+-------+--------+-------------------+           PSV cm/sEDV cmsDescribeArm Pressure (mmHG) +----------+--------+-------+--------+-------------------+ [id number]                                         +----------+--------+-------+--------+-------------------+ +---------+--------+--+--------+--+---------+ VertebralPSV cm/s32EDV cm/s10Antegrade  +---------+--------+--+--------+--+---------+  Left Carotid Findings: +----------+--------+--------+--------+------------------+--------+           PSV cm/sEDV cm/sStenosisPlaque DescriptionComments +----------+--------+--------+--------+------------------+--------+ CCA Prox  80                      heterogenous               +----------+--------+--------+--------+------------------+--------+ CCA Distal59      13              heterogenous               +----------+--------+--------+--------+------------------+--------+ ICA Prox  58      16      1-39%   heterogenous               +----------+--------+--------+--------+------------------+--------+ ICA Distal65      19                                         +----------+--------+--------+--------+------------------+--------+ ECA       66      11                                         +----------+--------+--------+--------+------------------+--------+ +----------+--------+--------+--------+-------------------+           PSV cm/sEDV cm/sDescribeArm Pressure (mmHG) +----------+--------+--------+--------+-------------------+ Subclavian105                                         +----------+--------+--------+--------+-------------------+ +---------+--------+---+--------+--+---------+  VertebralPSV cm/s101EDV cm/s18Antegrade +---------+--------+---+--------+--+---------+   Summary: Right Carotid: Velocities in the right ICA are consistent with a 1-39% stenosis. Left Carotid: Velocities in the left ICA are consistent with a 1-39% stenosis. Vertebrals: Bilateral vertebral arteries demonstrate antegrade flow. *See table(s) above for measurements and observations.  Electronically signed by Marvel Plan MD on 04/26/2021 at 8:29:05 AM.    Final    CT ANGIO HEAD NECK W WO CM W PERF (CODE STROKE)  Result Date: 04/23/2021 CLINICAL DATA:  Neuro deficit, acute, stroke suspected. Left facial droop and left arm tingling. EXAM:  CT ANGIOGRAPHY HEAD AND NECK CT PERFUSION BRAIN TECHNIQUE: Multidetector CT imaging of the head and neck was performed using the standard protocol during bolus administration of intravenous contrast. Multiplanar CT image reconstructions and MIPs were obtained to evaluate the vascular anatomy. Carotid stenosis measurements (when applicable) are obtained utilizing NASCET criteria, using the distal internal carotid diameter as the denominator. Multiphase CT imaging of the brain was performed following IV bolus contrast injection. Subsequent parametric perfusion maps were calculated using RAPID software. CONTRAST:  OMNIPAQUE IOHEXOL 350 MG/ML SOLN COMPARISON:  None. FINDINGS: CTA NECK FINDINGS Aortic arch: Standard 3 vessel aortic arch with a moderate amount of calcified plaque. Proximally 50% stenosis of the origin of the brachiocephalic artery. Right carotid system: Patent with heavily calcified plaque at the carotid bifurcation resulting in high-grade stenosis of the origin of the ICA with quantification limited as the densely calcified plaque obscures the small residual patent lumen. Left carotid system: Patent with a moderate amount of calcified plaque at the carotid bifurcation. No evidence of a significant stenosis or dissection. Vertebral arteries: The left vertebral artery is patent and strongly dominant without evidence of a significant stenosis or dissection. The right vertebral artery is patent and small with assessment of the V1 segment limited by venous contrast. There is calcified plaque at the origin of the right vertebral artery with assessment of a suspected associated stenosis limited due to poor visualization of the lumen due to the small size of the artery and density of the calcified plaque. Skeleton: Focally advanced disc degeneration at C5-6. Mild cervical facet arthrosis. Other neck: No evidence of cervical lymphadenopathy or mass. Upper chest: No apical lung consolidation or mass. Review  of the MIP images confirms the above findings CTA HEAD FINDINGS Anterior circulation: The internal carotid arteries are patent from skull base to carotid termini with calcified plaque resulting in mild cavernous stenosis on the right. ACAs and MCAs are patent without evidence of a proximal branch occlusion or significant proximal stenosis. There is a 2 mm aneurysm projecting laterally from the left MCA bifurcation. Posterior circulation: The intracranial vertebral arteries are patent to the basilar with calcified plaque resulting in moderate left V4 stenosis. The right V4 segment is hypoplastic and diffusely irregular. The basilar artery is widely patent. There is likely a small left posterior communicating artery. Both PCAs are patent without evidence of a significant proximal stenosis. No aneurysm is identified. Venous sinuses: As permitted by contrast timing, patent. Anatomic variants: Absent right A1 segment. Review of the MIP images confirms the above findings CT Brain Perfusion Findings: The study is motion degraded. ASPECTS: 10 CBF (<30%) Volume: 0 mL Perfusion (Tmax>6.0s) volume: 14 mL Mismatch Volume: 14 mL Infarction Location: No core infarct is identified by rapid processing. The small areas of prolonged Tmax are located in the right aspect of the posterior fossa, right occipital region, and high right frontal region and may be artifactual given involvement of  multiple vascular territories and motion. IMPRESSION: 1. No emergent large vessel occlusion. 2. 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 3. Mild intracranial atherosclerosis including mild right cavernous ICA and left V4 stenoses. 4. 2 mm left MCA bifurcation aneurysm. 5. Motion degraded CTP without evidence of an acute core infarct. Small foci of possible penumbra may be artifactual. 6.  Aortic Atherosclerosis (ICD10-I70.0). Emergent results were communicated to  Dr. Otelia Limes at 5:39 pm on 04/23/2021 by text page via the [facility] messaging system. Electronically Signed   By: Sebastian Ache M.D.   On: 04/23/2021 17:54     Subjective: He is alert, follows demonstration command. I use sign Ship broker . He was able to say ok when we told him he could go back home today.  No dyspnea.   Discharge Exam: Vitals:   04/28/21 0345 04/28/21 0819  BP: (!) 139/58 (!) 147/63  Pulse: (!) 47 (!) 53  Resp: 14 14  Temp: 98.2 F (36.8 C) (!) 96.1 F (35.6 C)  SpO2: 95% 95%     General: Pt is alert, awake, not in acute distress Cardiovascular: RRR, S1/S2 +, no rubs, no gallops Respiratory: CTA bilaterally, no wheezing, no rhonchi Abdominal: Soft, NT, ND, bowel sounds + Extremities: no edema, no cyanosis    The results of significant diagnostics from this hospitalization (including imaging, microbiology, ancillary and laboratory) are listed below for reference.     Microbiology: Recent Results (from the past 240 hour(s))  Resp Panel by RT-PCR (Flu A&B, Covid) Nasopharyngeal Swab     Status: Abnormal   Collection Time: 04/23/21  6:31 PM   Specimen: Nasopharyngeal Swab; Nasopharyngeal(NP) swabs in vial transport medium  Result Value Ref Range Status   SARS Coronavirus 2 by RT PCR POSITIVE (A) NEGATIVE Final    Comment: RESULT CALLED TO, READ BACK BY AND VERIFIED WITH: DACIA HARRIS 04/23/2021  BY JW (NOTE) SARS-CoV-2 target nucleic acids are DETECTED.  The SARS-CoV-2 RNA is generally detectable in upper respiratory specimens during the acute phase of infection. Positive results are indicative of the presence of the identified virus, but do not rule out bacterial infection or co-infection with other pathogens not detected by the test. Clinical correlation with patient history and other diagnostic information is necessary to determine patient infection status. The expected result is Negative.  Fact Sheet for  Patients: BloggerCourse.com  Fact Sheet for Healthcare Providers: SeriousBroker.it  This test is not yet approved or cleared by the [place] FDA and  has been authorized for detection and/or diagnosis of SARS-CoV-2 by FDA under an Emergency Use Authorization (EUA).  This EUA will remain in effect (meaning this test can be  used) for the duration of  the COVID-19 declaration under Section 564(b)(1) of the Act, 21 U.S.C. section 360bbb-3(b)(1), unless the authorization is terminated or revoked sooner.     Influenza A by PCR NEGATIVE NEGATIVE Final   Influenza B by PCR NEGATIVE NEGATIVE Final    Comment: (NOTE) The Xpert Xpress SARS-CoV-2/FLU/RSV plus assay is intended as an aid in the diagnosis of influenza from Nasopharyngeal swab specimens and should not be used as a sole basis for treatment. Nasal washings and aspirates are unacceptable for Xpert Xpress SARS-CoV-2/FLU/RSV testing.  Fact Sheet for Patients: BloggerCourse.com  Fact Sheet for Healthcare Providers: SeriousBroker.it  This test is not yet approved or cleared by the [place] FDA and has been authorized for detection and/or diagnosis of  SARS-CoV-2 by FDA under an Emergency Use Authorization (EUA). This EUA will remain in effect (meaning this test can be used) for the duration of the COVID-19 declaration under Section 564(b)(1) of the Act, 21 U.S.C. section 360bbb-3(b)(1), unless the authorization is terminated or revoked.  Performed at [facility], [address] [place], [name] [address]      Labs: BNP (last 3 results) No results for input(s): BNP in the last 8760 hours. Basic Metabolic Panel: Recent Labs  Lab 04/23/21 1649 04/23/21 1659 04/25/21 0612 04/26/21 0801 04/27/21 1953  NA 138 140 137 135 135  K 4.7 4.3 4.2 3.9 4.3  CL 105 104 104 103 102  CO2 26  --  25 26 23   GLUCOSE 132*  128* 130* 126* 210*  BUN 22 26* 21 31* 29*  CREATININE 1.32* 1.30* 1.21 1.23 1.20  CALCIUM 8.8*  --  8.9 8.2* 8.5*  MG  --   --   --  2.1 2.0   Liver Function Tests: Recent Labs  Lab 04/23/21 1649  AST 34  ALT 18  ALKPHOS 51  BILITOT 1.3*  PROT 6.1*  ALBUMIN 3.6   No results for input(s): LIPASE, AMYLASE in the last 168 hours. No results for input(s): AMMONIA in the last 168 hours. CBC: Recent Labs  Lab 04/23/21 1649 04/23/21 1659 04/25/21 0612 04/26/21 0801  WBC 8.1  --  10.5 8.6  NEUTROABS 6.1  --   --   --   HGB 16.3 16.3 16.3 15.1  HCT 49.8 48.0 49.2 44.8  MCV 94.5  --  92.0 92.4  PLT 309  --  332 310   Cardiac Enzymes: No results for input(s): CKTOTAL, CKMB, CKMBINDEX, TROPONINI in the last 168 hours. BNP: Invalid input(s): POCBNP CBG: Recent Labs  Lab 04/27/21 1555 04/27/21 2026 04/27/21 2321 04/28/21 0347 04/28/21 0818  GLUCAP 145* 202* 158* 124* 103*   D-Dimer No results for input(s): DDIMER in the last 72 hours. Hgb A1c No results for input(s): HGBA1C in the last 72 hours. Lipid Profile No results for input(s): CHOL, HDL, LDLCALC, TRIG, CHOLHDL, LDLDIRECT in the last 72 hours. Thyroid function studies No results for input(s): TSH, T4TOTAL, T3FREE, THYROIDAB in the last 72 hours.  Invalid input(s): FREET3 Anemia work up No results for input(s): VITAMINB12, FOLATE, FERRITIN, TIBC, IRON, RETICCTPCT in the last 72 hours. Urinalysis    Component Value Date/Time   COLORURINE YELLOW 04/24/2021 0203   APPEARANCEUR CLEAR 04/24/2021 0203   LABSPEC 1.026 04/24/2021 0203   PHURINE 6.0 04/24/2021 0203   GLUCOSEU NEGATIVE 04/24/2021 0203   HGBUR NEGATIVE 04/24/2021 0203   BILIRUBINUR NEGATIVE 04/24/2021 0203   KETONESUR 5 (A) 04/24/2021 0203   PROTEINUR NEGATIVE 04/24/2021 0203   NITRITE NEGATIVE 04/24/2021 0203   LEUKOCYTESUR NEGATIVE 04/24/2021 0203   Sepsis Labs Invalid input(s): PROCALCITONIN,  WBC,  LACTICIDVEN Microbiology Recent Results  (from the past 240 hour(s))  Resp Panel by RT-PCR (Flu A&B, Covid) Nasopharyngeal Swab     Status: Abnormal   Collection Time: 04/23/21  6:31 PM   Specimen: Nasopharyngeal Swab; Nasopharyngeal(NP) swabs in vial transport medium  Result Value Ref Range Status   SARS Coronavirus 2 by RT PCR POSITIVE (A) NEGATIVE Final    Comment: RESULT CALLED TO, READ BACK BY AND VERIFIED WITH: DACIA HARRIS 04/23/2021 @2036  BY JW (NOTE) SARS-CoV-2 target nucleic acids are DETECTED.  The SARS-CoV-2 RNA is generally detectable in upper respiratory specimens during the acute phase of infection. Positive results are indicative  of the presence of the identified virus, but do not rule out bacterial infection or co-infection with other pathogens not detected by the test. Clinical correlation with patient history and other diagnostic information is necessary to determine patient infection status. The expected result is Negative.  Fact Sheet for Patients: BloggerCourse.com  Fact Sheet for Healthcare Providers: SeriousBroker.it  This test is not yet approved or cleared by the [place] FDA and  has been authorized for detection and/or diagnosis of SARS-CoV-2 by FDA under an Emergency Use Authorization (EUA).  This EUA will remain in effect (meaning this test can be  used) for the duration of  the COVID-19 declaration under Section 564(b)(1) of the Act, 21 U.S.C. section 360bbb-3(b)(1), unless the authorization is terminated or revoked sooner.     Influenza A by PCR NEGATIVE NEGATIVE Final   Influenza B by PCR NEGATIVE NEGATIVE Final    Comment: (NOTE) The Xpert Xpress SARS-CoV-2/FLU/RSV plus assay is intended as an aid in the diagnosis of influenza from Nasopharyngeal swab specimens and should not be used as a sole basis for treatment. Nasal washings and aspirates are unacceptable for Xpert Xpress SARS-CoV-2/FLU/RSV testing.  Fact Sheet for  Patients: BloggerCourse.com  Fact Sheet for Healthcare Providers: SeriousBroker.it  This test is not yet approved or cleared by the [place] FDA and has been authorized for detection and/or diagnosis of SARS-CoV-2 by FDA under an Emergency Use Authorization (EUA). This EUA will remain in effect (meaning this test can be used) for the duration of the COVID-19 declaration under Section 564(b)(1) of the Act, 21 U.S.C. section 360bbb-3(b)(1), unless the authorization is terminated or revoked.  Performed at [facility], [address] [place], [name] [address]      Time coordinating discharge: 40 minutes  SIGNED:   Alba Cory, MD  [facility]

## 2021-04-28 NOTE — TOC Transition Note (Signed)
Transition of Care [facility]) - CM/SW Discharge Note   Patient Details  Name: Bobby Rojas MRN: [id number] Date of Birth: 1933-12-28  Transition of Care [facility]) CM/SW Contact:  Carley Hammed, LCSWA Phone Number: 04/28/2021, 10:45 AM   Clinical Narrative:    Pt to be transported via [facility] to [facility]. Nurse to call report to [phone].    Final next level of care: Assisted Living Barriers to Discharge: Barriers Resolved   Patient Goals and CMS Choice Patient states their goals for this hospitalization and ongoing recovery are:: patient unable to participate in goal setting, only oriented to self CMS Medicare.gov Compare Post Acute Care list provided to:: Patient Represenative (must comment) Choice offered to / list presented to : Adult Children  Discharge Placement              Patient chooses bed at:  [facility]) Patient to be transferred to facility by: [facility] Name of family member notified: [name] Patient and family notified of of transfer: 04/28/21  Discharge Plan and Services     Post Acute Care Choice: NA          DME Arranged: Oxygen DME Agency: [employer] Date DME Agency Contacted: 04/27/21 Time DME Agency Contacted: 1206 Representative spoke with at DME Agency: Vaughan Basta HH Arranged: PT, OT HH Agency: [facility] Date [facility] Agency Contacted: 04/27/21 Time HH Agency Contacted: 1207 Representative spoke with at [facility] Agency: Kandee Keen  Social Determinants of Health (SDOH) Interventions     Readmission Risk Interventions No flowsheet data found.

## 2021-04-28 NOTE — Plan of Care (Signed)

## 2021-04-29 LAB — GLUCOSE, CAPILLARY
Glucose-Capillary: 123 mg/dL — ABNORMAL HIGH (ref 70–99)
Glucose-Capillary: 139 mg/dL — ABNORMAL HIGH (ref 70–99)

## 2021-05-01 SURGERY — TRANSCAROTID ARTERY REVASCULARIZATION (TCAR)
Anesthesia: General | Laterality: Right

## 2021-06-18 DEATH — deceased

## 2021-06-26 ENCOUNTER — Inpatient Hospital Stay: Payer: Medicare Other | Admitting: Diagnostic Neuroimaging
